# Patient Record
Sex: Female | Born: 1970
Health system: Southern US, Community
[De-identification: ages and names within clinical notes are randomized; demographics above are authoritative.]

## PROBLEM LIST (undated history)

## (undated) DIAGNOSIS — G2581 Restless legs syndrome: Secondary | ICD-10-CM

## (undated) DIAGNOSIS — F329 Major depressive disorder, single episode, unspecified: Secondary | ICD-10-CM

## (undated) DIAGNOSIS — G43909 Migraine, unspecified, not intractable, without status migrainosus: Secondary | ICD-10-CM

## (undated) DIAGNOSIS — S022XXA Fracture of nasal bones, initial encounter for closed fracture: Secondary | ICD-10-CM

## (undated) DIAGNOSIS — K219 Gastro-esophageal reflux disease without esophagitis: Secondary | ICD-10-CM

## (undated) DIAGNOSIS — S0291XA Unspecified fracture of skull, initial encounter for closed fracture: Secondary | ICD-10-CM

## (undated) DIAGNOSIS — F909 Attention-deficit hyperactivity disorder, unspecified type: Secondary | ICD-10-CM

## (undated) DIAGNOSIS — E559 Vitamin D deficiency, unspecified: Secondary | ICD-10-CM

## (undated) HISTORY — DX: Major depressive disorder, single episode, unspecified: F32.9

## (undated) HISTORY — DX: Migraine, unspecified, not intractable, without status migrainosus: G43.909

## (undated) HISTORY — DX: Restless legs syndrome: G25.81

## (undated) HISTORY — DX: Vitamin D deficiency, unspecified: E55.9

## (undated) HISTORY — PX: WISDOM TOOTH EXTRACTION: SHX21

---

## 1998-06-28 ENCOUNTER — Encounter: Admission: RE | Admit: 1998-06-28 | Discharge: 1998-06-28 | Payer: Self-pay | Admitting: Family Medicine

## 1998-12-26 ENCOUNTER — Encounter: Admission: RE | Admit: 1998-12-26 | Discharge: 1998-12-26 | Payer: Self-pay | Admitting: Family Medicine

## 1999-01-09 ENCOUNTER — Encounter: Admission: RE | Admit: 1999-01-09 | Discharge: 1999-01-09 | Payer: Self-pay | Admitting: Family Medicine

## 1999-01-27 ENCOUNTER — Encounter: Admission: RE | Admit: 1999-01-27 | Discharge: 1999-01-27 | Payer: Self-pay | Admitting: Family Medicine

## 1999-01-29 ENCOUNTER — Encounter: Admission: RE | Admit: 1999-01-29 | Discharge: 1999-01-29 | Payer: Self-pay | Admitting: Family Medicine

## 1999-06-06 ENCOUNTER — Encounter: Admission: RE | Admit: 1999-06-06 | Discharge: 1999-06-06 | Payer: Self-pay | Admitting: Family Medicine

## 1999-07-06 ENCOUNTER — Emergency Department (HOSPITAL_COMMUNITY): Admission: EM | Admit: 1999-07-06 | Discharge: 1999-07-06 | Payer: Self-pay | Admitting: Emergency Medicine

## 2000-12-25 ENCOUNTER — Emergency Department (HOSPITAL_COMMUNITY): Admission: EM | Admit: 2000-12-25 | Discharge: 2000-12-25 | Payer: Self-pay | Admitting: Emergency Medicine

## 2002-06-04 ENCOUNTER — Emergency Department (HOSPITAL_COMMUNITY): Admission: EM | Admit: 2002-06-04 | Discharge: 2002-06-04 | Payer: Self-pay | Admitting: Emergency Medicine

## 2003-06-07 ENCOUNTER — Ambulatory Visit (HOSPITAL_COMMUNITY): Admission: RE | Admit: 2003-06-07 | Discharge: 2003-06-07 | Payer: Self-pay | Admitting: Infectious Diseases

## 2004-12-01 ENCOUNTER — Emergency Department (HOSPITAL_COMMUNITY): Admission: EM | Admit: 2004-12-01 | Discharge: 2004-12-01 | Payer: Self-pay | Admitting: Emergency Medicine

## 2005-08-21 ENCOUNTER — Other Ambulatory Visit: Admission: RE | Admit: 2005-08-21 | Discharge: 2005-08-21 | Payer: Self-pay | Admitting: Obstetrics and Gynecology

## 2005-10-11 ENCOUNTER — Emergency Department (HOSPITAL_COMMUNITY): Admission: EM | Admit: 2005-10-11 | Discharge: 2005-10-11 | Payer: Self-pay | Admitting: Emergency Medicine

## 2008-07-13 ENCOUNTER — Telehealth: Payer: Self-pay | Admitting: Family Medicine

## 2008-08-17 ENCOUNTER — Ambulatory Visit: Payer: Self-pay | Admitting: Family Medicine

## 2008-08-30 ENCOUNTER — Ambulatory Visit: Payer: Self-pay | Admitting: Family Medicine

## 2008-08-30 DIAGNOSIS — K219 Gastro-esophageal reflux disease without esophagitis: Secondary | ICD-10-CM | POA: Insufficient documentation

## 2008-08-30 DIAGNOSIS — G4700 Insomnia, unspecified: Secondary | ICD-10-CM | POA: Insufficient documentation

## 2008-09-01 ENCOUNTER — Encounter: Admission: RE | Admit: 2008-09-01 | Discharge: 2008-09-01 | Payer: Self-pay | Admitting: Sports Medicine

## 2008-09-11 ENCOUNTER — Encounter: Payer: Self-pay | Admitting: *Deleted

## 2008-09-17 ENCOUNTER — Ambulatory Visit: Payer: Self-pay | Admitting: Family Medicine

## 2008-09-17 DIAGNOSIS — J309 Allergic rhinitis, unspecified: Secondary | ICD-10-CM | POA: Insufficient documentation

## 2008-09-26 ENCOUNTER — Encounter: Payer: Self-pay | Admitting: *Deleted

## 2008-12-06 ENCOUNTER — Telehealth: Payer: Self-pay | Admitting: Family Medicine

## 2008-12-06 ENCOUNTER — Ambulatory Visit: Payer: Self-pay | Admitting: Family Medicine

## 2008-12-20 ENCOUNTER — Ambulatory Visit: Payer: Self-pay | Admitting: Family Medicine

## 2008-12-24 ENCOUNTER — Encounter: Payer: Self-pay | Admitting: *Deleted

## 2009-01-07 ENCOUNTER — Ambulatory Visit: Payer: Self-pay | Admitting: Family Medicine

## 2009-01-07 LAB — CONVERTED CEMR LAB
Cholesterol: 121 mg/dL (ref 0–200)
HDL: 42 mg/dL (ref >39)
LDL Cholesterol: 51 mg/dL (ref 0–99)
Total CHOL/HDL Ratio: 2.9
Triglycerides: 139 mg/dL (ref <150)
VLDL: 28 mg/dL (ref 0–40)

## 2009-01-08 ENCOUNTER — Encounter: Payer: Self-pay | Admitting: Family Medicine

## 2009-05-22 ENCOUNTER — Emergency Department (HOSPITAL_COMMUNITY): Admission: EM | Admit: 2009-05-22 | Discharge: 2009-05-22 | Payer: Self-pay | Admitting: Family Medicine

## 2009-07-29 ENCOUNTER — Ambulatory Visit: Payer: Self-pay | Admitting: Family Medicine

## 2009-07-29 LAB — CONVERTED CEMR LAB
ALT: 10 units/L (ref 0–35)
AST: 12 units/L (ref 0–37)
Albumin: 4.4 g/dL (ref 3.5–5.2)
Alkaline Phosphatase: 46 units/L (ref 39–117)
BUN: 10 mg/dL (ref 6–23)
CO2: 24 meq/L (ref 19–32)
Calcium: 9.8 mg/dL (ref 8.4–10.5)
Chloride: 105 meq/L (ref 96–112)
Creatinine, Ser: 0.74 mg/dL (ref 0.40–1.20)
Glucose, Bld: 85 mg/dL (ref 70–99)
HCT: 41.9 % (ref 36.0–46.0)
Hemoglobin: 14.1 g/dL (ref 12.0–15.0)
MCHC: 33.7 g/dL (ref 30.0–36.0)
MCV: 93.9 fL (ref 78.0–100.0)
Platelets: 308 10*3/uL (ref 150–400)
Potassium: 4 meq/L (ref 3.5–5.3)
RBC: 4.46 M/uL (ref 3.87–5.11)
RDW: 13 % (ref 11.5–15.5)
Sodium: 140 meq/L (ref 135–145)
TSH: 0.898 microintl units/mL (ref 0.350–4.500)
Total Bilirubin: 0.9 mg/dL (ref 0.3–1.2)
Total Protein: 6.8 g/dL (ref 6.0–8.3)
WBC: 7.8 10*3/uL (ref 4.0–10.5)

## 2009-09-02 ENCOUNTER — Ambulatory Visit: Payer: Self-pay | Admitting: Family Medicine

## 2009-10-17 ENCOUNTER — Telehealth: Payer: Self-pay | Admitting: *Deleted

## 2009-11-28 ENCOUNTER — Ambulatory Visit: Payer: Self-pay | Admitting: Family Medicine

## 2009-11-28 DIAGNOSIS — R42 Dizziness and giddiness: Secondary | ICD-10-CM | POA: Insufficient documentation

## 2009-11-28 LAB — CONVERTED CEMR LAB
Ferritin: 89 ng/mL (ref 10–291)
HCT: 38.4 % (ref 36.0–46.0)
Hemoglobin: 12.9 g/dL (ref 12.0–15.0)
MCHC: 33.6 g/dL (ref 30.0–36.0)
MCV: 95.5 fL (ref 78.0–100.0)
Platelets: 264 10*3/uL (ref 150–400)
RBC: 4.02 M/uL (ref 3.87–5.11)
RDW: 12.4 % (ref 11.5–15.5)
TSH: 3.291 microintl units/mL (ref 0.350–4.500)
WBC: 10.2 10*3/uL (ref 4.0–10.5)

## 2009-11-29 ENCOUNTER — Telehealth: Payer: Self-pay | Admitting: Family Medicine

## 2009-12-04 ENCOUNTER — Encounter: Payer: Self-pay | Admitting: *Deleted

## 2009-12-09 ENCOUNTER — Ambulatory Visit: Payer: Self-pay | Admitting: Family Medicine

## 2009-12-09 DIAGNOSIS — F4323 Adjustment disorder with mixed anxiety and depressed mood: Secondary | ICD-10-CM | POA: Insufficient documentation

## 2010-05-30 ENCOUNTER — Emergency Department (HOSPITAL_COMMUNITY)
Admission: EM | Admit: 2010-05-30 | Discharge: 2010-05-30 | Payer: Self-pay | Source: Home / Self Care | Admitting: Family Medicine

## 2010-06-24 ENCOUNTER — Encounter: Payer: Self-pay | Admitting: Family Medicine

## 2010-07-17 NOTE — Assessment & Plan Note (Signed)
Summary: f/u eo   Vital Signs:  Patient profile:   40 year old female Height:      66 inches Weight:      174 pounds BMI:     28.19 BSA:     1.89 Temp:     98.4 degrees F Pulse rate:   93 / minute BP sitting:   113 / 82  Vitals Entered By: Jone Baseman CMA (December 09, 2009 2:39 PM) CC: f/u Is Patient Diabetic? No Pain Assessment Patient in pain? no        CC:  f/u.  History of Present Illness: Depression Feeling very upset and crying frequently due to possible breaking up with her girlfriend and mind won't shut off.  No suicidal ideation.  Appetite is down and not sleeping well.  Did not call back to schedule an appt with a psychiatrist because was out of town.    Does have friends she can talk with.  Feels needs to go to work today.  Did take whole trazadone yesterday but still not able to sleep.    ROS - as above PMH - Medications reviewed and updated in medication list.  Smoking Status noted in VS form    Habits & Providers  Alcohol-Tobacco-Diet     Tobacco Status: never  Current Medications (verified): 1)  Lysteda 650 Mg Tabs (Tranexamic Acid) .... During Menstrual Period 6 Tabs Per Day Per Dr Algie Coffer 2)  Seroquel 100 Mg  Tabs (Quetiapine Fumarate) .... 1/2 At Bedtime May Increase To 2 After 1-2 Days  Allergies: 1)  ! Ultram  Physical Exam  General:  Well-developed,well-nourished,in no acute distress; alert,appropriate and cooperative throughout examination Psych:  Crying at beginning of visit and for most of interview but able to stop and compose herself when leaving.  Oriented no illusions or hallucinations.  No pressured speech.  Able to answer questions and come up with reasonable plans    Impression & Recommendations:  Problem # 1:  DEPRESSION (ICD-311) Assessment Deteriorated  probably bipolar type.  Given complexity and her family history feel she should be seen by psychiatrist.  Will stop trazadone and start seroquel.   No suicidal thoughts now  but is quite distraught.  Follow her closely until can get appointment with psychiatry  The following medications were removed from the medication list:    Trazodone Hcl 50 Mg Tabs (Trazodone hcl) .Marland Kitchen... 1 by mouth at bedtime for insomnia do not use every day  Orders: [facility]- Est  Level 4 ([address])  Complete Medication List: 1)  Lysteda 650 Mg Tabs (Tranexamic acid) .... During menstrual period 6 tabs per day per dr Algie Coffer 2)  Seroquel 100 Mg Tabs (Quetiapine fumarate) .... 1/2 at bedtime may increase to 2 after 1-2 days  Patient Instructions: 1)  Please schedule a follow-up appointment in 1 weeks - ok to dble book 2)  Call me if feeling worse 3)  Take 1/2 of seroquel 1 heart rate before bed time 4)  May increase to a whole tablet as needed 5)  Call for an appointment today with a psychiatrist  Prescriptions: SEROQUEL 100 MG  TABS (QUETIAPINE FUMARATE) 1/2 at bedtime may increase to 2 after 1-2 days  #30 x 0   Entered and Authorized by:   Pearlean Brownie MD   Signed by:   Pearlean Brownie MD on 12/09/2009   Method used:   Electronically to        [facility]* (retail)  [address]       [address] Shipping/mailing       [place], [place]  [address]       Ph: [phone]       Fax: [phone]   RxID:   [id number]

## 2010-07-17 NOTE — Assessment & Plan Note (Signed)
Summary: f/u meds,df   Vital Signs:  Patient profile:   40 year old female Height:      66 inches Weight:      183.1 pounds BMI:     29.66 Pulse rate:   89 / minute BP sitting:   143 / 86  (right arm) Cuff size:   regular  Vitals Entered By: Garen Grams LPN (July 29, 2009 4:10 PM)  CC: trouble sleeping Is Patient Diabetic? No Pain Assessment Patient in pain? no        CC:  trouble sleeping.  History of Present Illness: Mood Insomnia Has been very down since broke up with her partner in Jan?  Trouble sleeping, frequent crying, intentional weight loss.  Was on cloud nine before breakup.  No history of depression or bipolar or any previous medication therapy.  No suicidal ideation   Does have strong famiily hx of psychiatric disorders in her mother.   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 1 Tablet By Mouth Qd 2)  Zolpidem Tartrate 10 Mg Tabs (Zolpidem Tartrate) .Marland Kitchen.. 1 Tablet By Mouth Once Daily  Allergies: 1)  ! Ultram  Physical Exam  General:  Well-developed,well-nourished,in no acute distress; alert,appropriate and cooperative throughout examination Psych:  Cognition and judgment appear intact. Alert and cooperative with normal attention span and concentration. No apparent delusions, illusions, hallucinations   Impression & Recommendations:  Problem # 1:  INSOMNIA-SLEEP DISORDER-UNSPEC (ICD-780.52) Assessment Deteriorated  coupled with mood symptoms maybe more consistent with depression.  Her MDQ had only 3/13 positive with causing only a minor problem and PH9 only 4 points with somewhat difficult.   Still feel counseling would like be beneficial.  Will see her back soon Does not seem to be at risk for suicide Her updated  medication list for this problem includes:    Zolpidem Tartrate 10 Mg Tabs (Zolpidem tartrate) .Marland Kitchen... 1 tablet by mouth once daily  Orders: Comp Met[facility] ([address]) CBC-[facility] ([address]) TSH-[facility] ([id number]) [facility]- Est  Level 4 ([address])  Problem # 2:  GERD (ICD-530.81) Assessment: Improved Can take ppi as needed  Her updated medication list for this problem includes:    Protonix 40 Mg Tbec (Pantoprazole sodium) .Marland Kitchen... 1 tablet by mouth once daily prn  Complete Medication List: 1)  Protonix 40 Mg Tbec (Pantoprazole sodium) .Marland Kitchen.. 1 tablet by mouth once daily prn 2)  Zolpidem Tartrate 10 Mg Tabs (Zolpidem tartrate) .Marland Kitchen.. 1 tablet by mouth once daily  Patient Instructions: 1)  Please schedule a follow-up appointment in 2 weeks.  2)  Call me if you are feeling worse 3)  Consider a counselor call me if you would like a referral

## 2010-07-17 NOTE — Progress Notes (Signed)
Summary: meds prob  Phone Note Call from Patient Call back at Home Phone [phone]   Caller: Patient Summary of Call: ROZEREM 8 MG TABS (RAMELTEON) is definately not working and needs something else to help her sleep [facility] Initial call taken by: De Nurse,  Oct 17, 2009 2:35 PM  Follow-up for Phone Call        to pcp Follow-up by: Golden Circle RN,  Oct 17, 2009 2:57 PM  Additional Follow-up for Phone Call Additional follow up Details #1::        Rx'd trazadone as a trial.  Do not use it every night.   She needs to come in for ov and Pap if she wants to have it done here Additional Follow-up by: Pearlean Brownie MD,  Oct 17, 2009 3:19 PM    Additional Follow-up for Phone Call Additional follow up Details #2::    Left message on vm for patient to return call. Follow-up by: Garen Grams LPN,  Oct 17, 1960 3:36 PM  Additional Follow-up for Phone Call Additional follow up Details #3:: Details for Additional Follow-up Action Taken: Patient informed of above, states she has her paps done at her OBGYN office (Dr. Algie Coffer). States she will schedule an ov in the next couple weeks to f/u on how new med is working. Additional Follow-up by: Garen Grams LPN,  Oct 18, 2295 5:14 PM  New/Updated Medications: TRAZODONE HCL 50 MG TABS (TRAZODONE HCL) 1 by mouth at bedtime for insomnia do not use every day Prescriptions: TRAZODONE HCL 50 MG TABS (TRAZODONE HCL) 1 by mouth at bedtime for insomnia do not use every day  #20 x 1   Entered and Authorized by:   Pearlean Brownie MD   Signed by:   Pearlean Brownie MD on 10/17/2009   Method used:   Electronically to        [facility]* (retail)       [address]       [address] Shipping/mailing       [place], [place]  [address]       Ph: [phone]       Fax: [phone]   RxID:   [id number]

## 2010-07-17 NOTE — Miscellaneous (Signed)
Summary: Re; Psychiatic referral  Clinical Lists Changes  called and left message yesterday and today asking patient to call back. need to speak with her about referral.  Theresia Lo RN  December 04, 2009 4:06 PM  patient in office today and gave her information regarding psychiatric referral. she will contact [facility] to schedule. advised her to call us back if any problem. Theresia Lo RN  December 09, 2009 4:27 PM

## 2010-07-17 NOTE — Assessment & Plan Note (Signed)
Summary: f/up,tcb   Vital Signs:  Patient profile:   40 year old female Height:      66 inches Weight:      181 pounds BMI:     29.32 BSA:     1.92 Temp:     98.5 degrees F Pulse rate:   89 / minute BP sitting:   144 / 85  Vitals Entered By: Jone Baseman CMA (September 02, 2009 3:09 PM) CC: f/u Is Patient Diabetic? No Pain Assessment Patient in pain? yes     Location: throat Intensity: 8   CC:  f/u.  History of Present Illness: Insomnia continues almost every night.  Ambien not helping much.  Has worked on Physiological scientist.  Feels her mind is racing when tries to sleep.  No anxiety problems during the day and no anhedonia or feeling down.    Goes to sleep at 5 AM and tries to Sleep to 1-2 PM.  Exercises about 2 AM.  No suicidal ideation  URI symptoms Cough with post nasal drip and sore throat for 4-5 days. No fever or focal chest pain or shortness of breath. No rash OTC medications helps a little.  Does not have regular spring allergies.    ROS - as above PMH - Medications reviewed and updated in medication list.  Smoking Status noted in VS form    Habits & Providers  Alcohol-Tobacco-Diet     Tobacco Status: never  Current Medications (verified): 1)  Protonix 40 Mg Tbec (Pantoprazole Sodium) .Marland Kitchen.. 1 Tablet By Mouth Once Daily Prn 2)  Zolpidem Tartrate 10 Mg Tabs (Zolpidem Tartrate) .Marland Kitchen.. 1 Tablet By Mouth Once Daily  Allergies: 1)  ! Ultram  Physical Exam  General:  Well-developed,well-nourished,in no acute distress; alert,appropriate and cooperative throughout examination Mouth:  Oral mucosa and oropharynx without lesions or exudates.  Teeth in good repair.  Visible clear drainage in pharynx  Neck:  No deformities, masses, or tenderness noted. Lungs:  Normal respiratory effort, chest expands symmetrically. Lungs are clear to auscultation, no crackles or wheezes. Heart:  Normal rate and regular rhythm. S1 and S2 normal without gallop, murmur, click, rub or other  extra sounds. Skin:  Intact without suspicious lesions or rashes   Impression & Recommendations:  Problem # 1:  INSOMNIA-SLEEP DISORDER-UNSPEC (ICD-780.52)  Worsened.  Discussed how should not need hypnotics regularly and that some people will not have uninterrupted sleep.  Will try as needed rozerem.   The following medications were removed from the medication list:    Zolpidem Tartrate 10 Mg Tabs (Zolpidem tartrate) .Marland Kitchen... 1 tablet by mouth once daily Her updated medication list for this problem includes:    Rozerem 8 Mg Tabs (Ramelteon) .Marland Kitchen... 1 nightly for sleep 30 before bed do not use regularly  Orders: [facility]- Est  Level 4 ([address])  Problem # 2:  URI (ICD-465.9)  No signs of pneumonia or sinustitis.  Likely viral or allergic.  treat with antihistamine decongestant.  She can not use any nasal sprays  Her updated medication list for this problem includes:    Allegra-d 12 Hour 60-120 Mg Xr12h-tab (Fexofenadine-pseudoephedrine) .Marland Kitchen... 1every 12 hours for congestion and drainage  Orders: [facility]- Est  Level 4 ([address])  Complete Medication List: 1)  Protonix 40 Mg Tbec (Pantoprazole sodium) .Marland Kitchen.. 1 tablet by mouth once daily prn 2)  Rozerem 8 Mg Tabs (Ramelteon) .Marland Kitchen.. 1 nightly for sleep 30 before bed do not use regularly 3)  Allegra-d 12 Hour 60-120 Mg Xr12h-tab (Fexofenadine-pseudoephedrine) .Marland Kitchen.. 1every 12  hours for congestion and drainage  Patient Instructions: 1)  Call if not better in 10 day the cough sometimes lasts up to4-6 weeks 2)  Use the sleeping aid intermittently as needed Prescriptions: ALLEGRA-D 12 HOUR 60-120 MG XR12H-TAB (FEXOFENADINE-PSEUDOEPHEDRINE) 1every 12 hours for congestion and drainage  #20 x 0   Entered and Authorized by:   Pearlean Brownie MD   Signed by:   Pearlean Brownie MD on 09/02/2009   Method used:   Electronically to        [facility]* (retail)       [address]       [address] Shipping/mailing       [place], [place]   [address]       Ph: [phone]       Fax: [phone]   RxID:   [id number] ROZEREM 8 MG TABS (RAMELTEON) 1 nightly for sleep 30 before bed do not use regularly  #10 x 2   Entered and Authorized by:   Pearlean Brownie MD   Signed by:   Pearlean Brownie MD on 09/02/2009   Method used:   Electronically to        [facility]* (retail)       [address]       [address] Shipping/mailing       [place], [place]  [address]       Ph: [phone]       Fax: [phone]   RxID:   [id number]

## 2010-07-17 NOTE — Letter (Signed)
Summary: Generic Letter  [facility]  [address]   [place], [place] [address]   Phone: [phone]  Fax: [phone]    06/24/2010  [facility] [address] [place], [place]  [address]  Dear Ms. Sherry Ewing,  I have not seen you for a while and am writing to check to see how you are.    I trust the holidays went well.  If you need to contact me please call and leave a message and I will get back to you.   Sincerely,  Doneta Public MD  Appended Document: Generic Letter mailed

## 2010-07-17 NOTE — Progress Notes (Signed)
Summary: phn msg  Phone Note Call from Patient Call back at [facility] Phone [phone]   Caller: Patient Summary of Call: Pt says she is returning Dr Russ Halo call. Initial call taken by: Clydell Hakim,  November 29, 2009 3:40 PM  Follow-up for Phone Call        Discussed labs with her and her referral to Dr Katrinka Blazing Follow-up by: Pearlean Brownie MD,  November 29, 2009 3:50 PM

## 2010-07-17 NOTE — Assessment & Plan Note (Signed)
Summary: anxiety,df   Vital Signs:  Patient profile:   40 year old female Height:      66 inches Weight:      181 pounds BMI:     29.32 Temp:     98.2 degrees F oral Pulse rate:   73 / minute BP sitting:   132 / 83  (left arm) Cuff size:   regular  Vitals Entered By: Tessie Fass CMA (November 28, 2009 9:36 AM) Is Patient Diabetic? No Pain Assessment Patient in pain? no        History of Present Illness: Dizziness a feeling of very difficult to describe lightheadedness, euphoria, floating with menstrual period and occaisionally other times.  No chest pain or syncope or palpitations.   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  No recreational drugs.  Is sleeping well with trazadone.   Keeps a very busy schedule with work and exercise.   Recently started on Lyseda for menstrual period control by Dr Algie Coffer GYN also oxycodone for pain during menstrual period  ROS - as above PMH - Medications reviewed and updated in medication list.  Smoking Status noted in VS form    Habits & Providers  Alcohol-Tobacco-Diet     Tobacco Status: never  Current Medications (verified): 1)  Trazodone Hcl 50 Mg Tabs (Trazodone Hcl) .Marland Kitchen.. 1 By Mouth At Bedtime For Insomnia Do Not Use Every Day 2)  Lysteda 650 Mg Tabs (Tranexamic Acid) .... During Menstrual Period 6 Tabs Per Day Per Dr Algie Coffer  Allergies: 1)  ! Ultram  Social History: Pt lives alone.  Has relationship with a girl friend.  She works in Arts administrator at [employer].  She enjoys reading, movies, spending time with friends.  She drinks occassionally socially 1-2 drinks.   Physical Exam  General:  Well-developed,well-nourished,in no acute distress; alert,appropriate and cooperative throughout examination Psych:  mildly tangential speech but recovers on her own. Laughs mildly inappropriately.   Using her phone to list her concerns.  No  suicidal ideation    Impression & Recommendations:  Problem # 1:  DIZZINESS (ICD-780.4) Will check labs but most consistent with mental illness perhaps bipolar perhaps worsened with trazadone?   Has taken BDI and Bipolar written assements before and scored low but if labs are normal feel she should be evaluated by mental health professional. Her mother has bipolar and saw Dr Elna Breslow in past, she would not mind seeing him.  Orders: CBC-[facility] ([address]) TSH-[facility] ([id number]) Ferritin-[facility] ([address]) [facility]- Est  Level 4 ([address])  Problem # 2:  INSOMNIA-SLEEP DISORDER-UNSPEC (ICD-780.52) decrease to 25 mg at bedtime  The following medications were removed from the medication list:    Rozerem 8 Mg Tabs (Ramelteon) .Marland Kitchen... 1 nightly for sleep 30 before bed do not use regularly  Orders: [facility]- Est  Level 4 ([address])  Complete Medication List: 1)  Trazodone Hcl 50 Mg Tabs (Trazodone hcl) .Marland Kitchen.. 1 by mouth at bedtime for insomnia do not use every day 2)  Lysteda 650 Mg Tabs (Tranexamic acid) .... During menstrual period 6 tabs per day per dr Algie Coffer  Patient Instructions: 1)  Please schedule a follow-up appointment in 2 weeks.  2)  I will call you if your lab is abnormal otherwise I will send you a letter within 2 weeks. 3)  I will call with a suggestion of a mental health person to see 4)  Try 1/2 tablet of  trazadone to help with sleep   Appended Document: Orders Update - Referral    Clinical Lists Changes  Orders: Added new Referral order of Psychiatric Referral (Psych) - Signed

## 2010-07-28 ENCOUNTER — Encounter: Payer: Self-pay | Admitting: Family Medicine

## 2010-07-28 ENCOUNTER — Ambulatory Visit (INDEPENDENT_AMBULATORY_CARE_PROVIDER_SITE_OTHER): Payer: 59 | Admitting: Family Medicine

## 2010-07-28 DIAGNOSIS — F3289 Other specified depressive episodes: Secondary | ICD-10-CM

## 2010-07-28 DIAGNOSIS — N926 Irregular menstruation, unspecified: Secondary | ICD-10-CM

## 2010-07-28 DIAGNOSIS — M25579 Pain in unspecified ankle and joints of unspecified foot: Secondary | ICD-10-CM

## 2010-07-28 DIAGNOSIS — M25572 Pain in left ankle and joints of left foot: Secondary | ICD-10-CM

## 2010-07-28 DIAGNOSIS — F329 Major depressive disorder, single episode, unspecified: Secondary | ICD-10-CM

## 2010-07-28 NOTE — Assessment & Plan Note (Signed)
Seeing Dr Algie Coffer for gyn care.  Currently on mirena and BCPs

## 2010-07-28 NOTE — Patient Instructions (Signed)
Try padding for the ankle if it is not better call we can order an xray Keep exercising Your cholesterol is great Check your blood pressure over the next month

## 2010-07-28 NOTE — Progress Notes (Signed)
  Subjective:    Patient ID: Sherry Ewing, female    DOB: February 16, 1971, 40 y.o.   MRN: [id number]  HPI  L ankle pain Larey Seat while climbing in Sept or October.  Had negative xray.  Is back to full activity but has focal pain over distal lateral ankle.  No soft tissue swelling or bruising.  Able to walk run hop without pain   Depression Seeing Dr Shawnee Knapp.  Was on many meds but stopped all recently.  Feels well now.  No suicidal ideation.    Review of Systems     Objective:   Physical Exam    L ankle - FROM very focally tender over lateral distal malleolus.  No swelling or crepitus.  Able to stand and hop on one leg without pain or instability   Psych:  Good affection and conversation    Assessment & Plan:

## 2010-07-28 NOTE — Assessment & Plan Note (Signed)
Most consistent with pain after a sprain.  Has not laxity or swelling so think is healing appropriately.  Would not reimage unless persists.  She is concerned about having to wear boots with cause local pain suggested padding

## 2010-07-28 NOTE — Assessment & Plan Note (Signed)
Seeing Dr Shawnee Knapp for further care

## 2010-07-29 ENCOUNTER — Encounter: Payer: Self-pay | Admitting: Family Medicine

## 2010-07-30 ENCOUNTER — Inpatient Hospital Stay (HOSPITAL_COMMUNITY): Payer: 59

## 2010-07-30 ENCOUNTER — Inpatient Hospital Stay (HOSPITAL_COMMUNITY)
Admission: AD | Admit: 2010-07-30 | Discharge: 2010-07-30 | Disposition: A | Discharge status: Home / Self Care | Payer: 59 | Source: Ambulatory Visit | Attending: Obstetrics & Gynecology | Admitting: Obstetrics & Gynecology

## 2010-07-30 DIAGNOSIS — N949 Unspecified condition associated with female genital organs and menstrual cycle: Secondary | ICD-10-CM | POA: Insufficient documentation

## 2010-07-30 DIAGNOSIS — R52 Pain, unspecified: Secondary | ICD-10-CM

## 2010-07-30 DIAGNOSIS — N83209 Unspecified ovarian cyst, unspecified side: Secondary | ICD-10-CM | POA: Insufficient documentation

## 2010-07-30 LAB — URINE MICROSCOPIC-ADD ON

## 2010-07-30 LAB — URINALYSIS, ROUTINE W REFLEX MICROSCOPIC
Bilirubin Urine: NEGATIVE
Ketones, ur: NEGATIVE mg/dL
Leukocytes, UA: NEGATIVE
Nitrite: NEGATIVE
Protein, ur: NEGATIVE mg/dL
Specific Gravity, Urine: >1.03 — ABNORMAL HIGH (ref 1.005–1.030)
Urine Glucose, Fasting: NEGATIVE mg/dL
Urobilinogen, UA: 0.2 mg/dL (ref 0.0–1.0)
pH: 5.5 (ref 5.0–8.0)

## 2010-07-30 LAB — POCT PREGNANCY, URINE: Preg Test, Ur: NEGATIVE

## 2013-02-07 ENCOUNTER — Emergency Department (HOSPITAL_BASED_OUTPATIENT_CLINIC_OR_DEPARTMENT_OTHER): Payer: 59

## 2013-02-07 ENCOUNTER — Encounter (HOSPITAL_BASED_OUTPATIENT_CLINIC_OR_DEPARTMENT_OTHER): Payer: Self-pay | Admitting: *Deleted

## 2013-02-07 ENCOUNTER — Emergency Department (HOSPITAL_BASED_OUTPATIENT_CLINIC_OR_DEPARTMENT_OTHER)
Admission: EM | Admit: 2013-02-07 | Discharge: 2013-02-07 | Disposition: A | Discharge status: Home / Self Care | Payer: 59 | Attending: Emergency Medicine | Admitting: Emergency Medicine

## 2013-02-07 DIAGNOSIS — R296 Repeated falls: Secondary | ICD-10-CM | POA: Insufficient documentation

## 2013-02-07 DIAGNOSIS — S93402A Sprain of unspecified ligament of left ankle, initial encounter: Secondary | ICD-10-CM

## 2013-02-07 DIAGNOSIS — S93409A Sprain of unspecified ligament of unspecified ankle, initial encounter: Secondary | ICD-10-CM | POA: Insufficient documentation

## 2013-02-07 DIAGNOSIS — S93602A Unspecified sprain of left foot, initial encounter: Secondary | ICD-10-CM

## 2013-02-07 DIAGNOSIS — Y939 Activity, unspecified: Secondary | ICD-10-CM | POA: Insufficient documentation

## 2013-02-07 DIAGNOSIS — Z79899 Other long term (current) drug therapy: Secondary | ICD-10-CM | POA: Insufficient documentation

## 2013-02-07 DIAGNOSIS — Y929 Unspecified place or not applicable: Secondary | ICD-10-CM | POA: Insufficient documentation

## 2013-02-07 DIAGNOSIS — F909 Attention-deficit hyperactivity disorder, unspecified type: Secondary | ICD-10-CM | POA: Insufficient documentation

## 2013-02-07 DIAGNOSIS — K219 Gastro-esophageal reflux disease without esophagitis: Secondary | ICD-10-CM | POA: Insufficient documentation

## 2013-02-07 HISTORY — DX: Attention-deficit hyperactivity disorder, unspecified type: F90.9

## 2013-02-07 HISTORY — DX: Gastro-esophageal reflux disease without esophagitis: K21.9

## 2013-02-07 MED ORDER — IBUPROFEN 800 MG PO TABS
800.0000 mg | ORAL_TABLET | Freq: Once | ORAL | Status: AC
  Administered 2013-02-07: 800 mg via ORAL

## 2013-02-07 MED ORDER — IBUPROFEN 800 MG PO TABS
ORAL_TABLET | ORAL | Status: AC
  Administered 2013-02-07: 800 mg via ORAL
  Filled 2013-02-07: qty 1

## 2013-02-07 MED ORDER — HYDROCODONE-ACETAMINOPHEN 5-325 MG PO TABS: 1.0000 | ORAL_TABLET | Freq: Four times a day (QID) | ORAL | Status: DC | PRN

## 2013-02-07 NOTE — ED Provider Notes (Signed)
Scribed for Hanley Seamen, MD, the patient was seen in room MH02/MH02. This chart was scribed by Lewanda Rife, ED scribe. Patient's care was started at 0050  CSN: [id number]     Arrival date & time 02/07/13  0036 History   First MD Initiated Contact with Patient 02/07/13 0049     Chief Complaint  Patient presents with  . Foot Injury   (Consider location/radiation/quality/duration/timing/severity/associated sxs/prior Treatment) The history is provided by the patient.   HPI Comments: Sherry Ewing is a 42 y.o. female who presents to the Emergency Department complaining of constant moderate left foot and ankle onset acute PTA during a mechanical fall. Denies associated head injury, neck pain, back pain, left calf pain, and abdominal pain. Reports pain is exacerbated with weight bearing and alleviated by nothing. Denies taking any medications PTA to alleviate symptoms.      Past Medical History  Diagnosis Date  . ADHD (attention deficit hyperactivity disorder)   . GERD (gastroesophageal reflux disease)    Past Surgical History  Procedure Laterality Date  . Wisdom tooth extraction     No family history on file. History  Substance Use Topics  . Smoking status: Never Smoker   . Smokeless tobacco: Not on file  . Alcohol Use: Yes   OB History   Grav Para Term Preterm Abortions TAB SAB Ect Mult Living                 Review of Systems A complete 10 system review of systems was obtained and all systems are negative except as noted in the HPI and PMH.    Allergies  Tramadol hcl  Home Medications   Current Outpatient Rx  Name  Route  Sig  Dispense  Refill  . lisdexamfetamine (VYVANSE) 40 MG capsule   Oral   Take 40 mg by mouth every morning.         . pantoprazole (PROTONIX) 40 MG tablet   Oral   Take 40 mg by mouth daily.         . norgestrel-ethinyl estradiol (LO/OVRAL) 0.3-30 MG-MCG per tablet   Oral   Take 1 tablet by mouth daily.            BP 155/105   Pulse 88  Temp(Src) 98.3 F (36.8 C) (Oral)  Resp 18  Ht 5\' 6"  (1.676 m)  Wt 210 lb (95.255 kg)  BMI 33.91 kg/m2  SpO2 100% Physical Exam  Nursing note and vitals reviewed. Constitutional: She is oriented to person, place, and time. She appears well-developed and well-nourished. No distress.  HENT:  Head: Normocephalic and atraumatic.  Eyes: Conjunctivae and EOM are normal.  Neck: Neck supple. No tracheal deviation present.  Cardiovascular: Normal rate, regular rhythm, normal heart sounds, intact distal pulses and normal pulses.   No murmur heard. Pulses:      Dorsalis pedis pulses are 2+ on the left side.       Posterior tibial pulses are 2+ on the left side.  Cap refill < 3 seconds  Pulmonary/Chest: Effort normal and breath sounds normal. No respiratory distress. She has no wheezes.  Abdominal: Soft. Bowel sounds are normal. There is no tenderness.  Musculoskeletal: Normal range of motion.       Left ankle: Tenderness. Medial malleolus tenderness found. No proximal fibula tenderness found.       Cervical back: Normal. She exhibits no tenderness and no bony tenderness.       Thoracic back: Normal. She exhibits no tenderness and  no bony tenderness.       Lumbar back: Normal. She exhibits no tenderness and no bony tenderness.       Left lower leg: Normal. She exhibits no tenderness.  Tenderness over medial left arch   Neurological: She is alert and oriented to person, place, and time.  Distally neurovascularly intact   Skin: Skin is warm and dry.  Psychiatric: She has a normal mood and affect. Her behavior is normal.    ED Course  Procedures (including critical care time)   MDM  Nursing notes and vitals signs, including pulse oximetry, reviewed.  Summary of this visit's results, reviewed by myself:  Labs:  No results found for this or any previous visit (from the past 24 hour(s)).  Imaging Studies: Dg Ankle Complete Left  Feb 10, 2013   *RADIOLOGY REPORT*  Clinical  Data: Foot injury, now with medial ankle pain  LEFT ANKLE COMPLETE - 3+ VIEW  Comparison: 05/30/2010; left foot radiographs - earlier same day  Findings:  There is mild soft tissue swelling about the lateral malleolus. This finding is not associated as this fractures this location. The ankle mortise is preserved.  Joint spaces are preserved.  No ankle joint effusion.  Small plantar calcaneal spur.  IMPRESSION: 1.  Mild soft tissue swelling about the lateral malleolus without associated fracture. 2.  Small plantar calcaneal spur.   Original Report Authenticated By: Tacey Ruiz, MD   Dg Foot Complete Left  02-10-13   *RADIOLOGY REPORT*  Clinical Data: Post foot injury, now with plantar foot pain  LEFT FOOT - COMPLETE 3+ VIEW  Comparison: Left ankle radiographs - earlier same day  Findings:  No fracture or dislocation.  Joint spaces are preserved.  No erosions.  Regional soft tissues are normal.  No radiopaque foreign body.  Small plantar calcaneal spur.  IMPRESSION: 1.  No fracture or dislocation. 2.  Small plantar calcaneal spur.   Original Report Authenticated By: Tacey Ruiz, MD      I personally performed the services described in this documentation, which was scribed in my presence.  The recorded information has been reviewed and is accurate.   Hanley Seamen, MD 02/10/2013 [phone]

## 2013-02-07 NOTE — ED Notes (Signed)
Pt reports loosing her balance and falling, landed on her left foot. C/o pain in left foot and ankle.

## 2013-02-07 NOTE — ED Notes (Signed)
Rx x 1 given for norco- pt given ice pack for home use- pt has a ride

## 2014-07-23 ENCOUNTER — Emergency Department (HOSPITAL_COMMUNITY)
Admission: EM | Admit: 2014-07-23 | Discharge: 2014-07-23 | Disposition: A | Discharge status: Home / Self Care | Payer: Worker's Compensation | Attending: Emergency Medicine | Admitting: Emergency Medicine

## 2014-07-23 ENCOUNTER — Encounter (HOSPITAL_COMMUNITY): Payer: Self-pay | Admitting: Emergency Medicine

## 2014-07-23 DIAGNOSIS — F909 Attention-deficit hyperactivity disorder, unspecified type: Secondary | ICD-10-CM | POA: Diagnosis not present

## 2014-07-23 DIAGNOSIS — Z79899 Other long term (current) drug therapy: Secondary | ICD-10-CM | POA: Diagnosis not present

## 2014-07-23 DIAGNOSIS — K219 Gastro-esophageal reflux disease without esophagitis: Secondary | ICD-10-CM | POA: Insufficient documentation

## 2014-07-23 DIAGNOSIS — Z7721 Contact with and (suspected) exposure to potentially hazardous body fluids: Secondary | ICD-10-CM | POA: Insufficient documentation
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
## 2014-07-23 NOTE — Discharge Instructions (Signed)
Body Fluid Exposure Information °People may come into contact with blood and other body fluids under various circumstances. In some cases, body fluids may contain germs (bacteria or viruses) that cause infections. These germs can be spread when another person's body fluids come into contact with your skin, mouth, eyes, or genitals.  °Exposure to body fluids that may contain infectious material is a common problem for people providing care for others who are ill. It can occur when a person is performing health care tasks in the workplace or when taking care of a family member at home. Other common methods of exposure include injection drug use, sharing needles, and sexual activity. °The risk of an infection spreading through body fluid exposure is small and depends on a variety of factors. This includes the type of body fluid, the nature of the exposure, and the health status of the person who was the source of the body fluids. Your health care provider can help you assess the risk. °WHAT TYPES OF BODY FLUID CAN SPREAD INFECTION? °The following types of body fluid have the potential to spread infections: °· Blood. °· Semen. °· Vaginal secretions. °· Urine. °· Feces. °· Saliva. °· Nasal or eye discharge. °· Breast milk. °· Amniotic fluid and fluids surrounding body organs. °WHAT ARE SOME FIRST-AID MEASURES FOR BODY FLUID EXPOSURE? °The following steps should be taken as soon as possible after a person is exposed to body fluids: °Intact Skin °· For contact with closed skin, wash the area with soap and water. °Broken Skin °· For contact with broken skin (a wound), wash the area with soap and water. Let the area bleed a little. Then place a bandage or clean towel on the wound, applying gentle pressure to stop the bleeding. Do not squeeze or rub the area. °· Use just water or hand sanitizer if a sink with soap is not available. °· Do not use harsh chemicals such as bleach or iodine. °Eyes °· Rinse the eyes with water or  saline for 30 seconds. °· If the person is wearing contact lenses, leave the contact lenses in while rinsing the eyes. Once the rinsing is complete, remove the contact lenses. °Mouth °· Spit out the fluids. Rinse and spit with water 4-5 times. °In addition, you should remove any clothing that comes into contact with body fluids. However, if body fluid exposure results from sexual assault, seek medical care immediately without changing clothes or bathing. °WHEN SHOULD YOU SEEK HELP? °After performing the proper first-aid steps, you should contact your health care provider or seek emergency care right away if blood or other body fluids made contact with areas of broken skin or openings such as the eyes or mouth. If the exposure to body fluid happened in the workplace, you should report it to your work supervisor immediately. Many workplaces have procedures in place for exposure situations. °WHAT WILL HAPPEN AFTER YOU REPORT THE EXPOSURE? °Your health care provider will ask you several questions. Information requested may include: °· Your medical history, including vaccination records. °· Date and time of the exposure. °· Whether you saw body fluids during the exposure. °· Type of body fluid you were exposed to. °· Volume of body fluid you were exposed to. °· How the exposure happened. °· If any devices, such as a needle, were being used. °· Which area of your body made contact with the body fluid. °· Description of any injury to the skin or other area. °· How long contact was made with the body   fluid. °· Any information you have about the health status of the person whose body fluid you were exposed to. °The health care provider will assess your risk of infection. Often, no treatment is necessary. In some cases, the health care provider may recommend doing blood tests right away. Follow-up blood tests may also be done at certain intervals during the upcoming weeks and months to check for changes. You may be offered  treatment to prevent an infection from developing after exposure (post-exposure prophylaxis). This may include certain vaccinations or medicines and may be necessary when there is a risk of a serious infection, such as HIV or hepatitis B. Your health care provider should discuss appropriate treatment and vaccinations with you. °HOW CAN YOU PREVENT EXPOSURE AND INFECTION? °Always remember that prevention is the first line of defense against body fluid exposure. To help prevent exposure to body fluids: °· Wash and disinfect countertops and other surfaces regularly. °· Wear appropriate protective gear such as gloves, gowns, or eyewear when the possibility of exposure is present. °· Wipe away spills of body fluid with disposable towels. °· Properly dispose of blood products and other fluids. Use secured bags. °· Properly dispose of needles and other instruments with sharp points or edges (sharps). Use closed, marked containers. °· Avoid injection drug use. °· Do not share needles. °· Avoid recapping needles. °· Use a condom during sexual intercourse. °· Make sure you learn and follow any guidelines for preventing exposure (universal precautions) provided at your workplace. °To help reduce your chances of getting an infection: °· Make sure your vaccinations are up-to-date, including those for tetanus and hepatitis. °· Wash your hands frequently with soap and water. Use hand sanitizers. °· Avoid having multiple sex partners. °· Follow up with your health care provider as directed after being evaluated for an exposure to body fluids. °To avoid spreading infection to others: °· Do not have sexual relations until you know you are free of infection. °· Do not donate blood, plasma, breast milk, sperm, or other body fluids. °· Do not share hygiene tools such as toothbrushes, razors, or dental floss. °· Keep open wounds covered. °· Dispose of any items with blood on them (razors, tampons, bandages) by putting them in the  trash. °· Do not share drug supplies with others, such as needles, syringes, straws, or pipes. °· Follow all of your health care provider's instructions for preventing the spread of infection. °Document Released: 02/01/2013 Document Revised: 06/06/2013 Document Reviewed: 02/01/2013 °ExitCare® Patient Information ©2015 [employer]. This information is not intended to replace advice given to you by your health care provider. Make sure you discuss any questions you have with your health care provider. ° °
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
## 2014-07-23 NOTE — ED Notes (Signed)
Pt a/o x 4 on d/c. 

## 2014-09-14 ENCOUNTER — Encounter (HOSPITAL_COMMUNITY): Payer: Self-pay | Admitting: Emergency Medicine

## 2014-09-14 ENCOUNTER — Emergency Department (HOSPITAL_COMMUNITY)
Admission: EM | Admit: 2014-09-14 | Discharge: 2014-09-14 | Disposition: A | Discharge status: Home / Self Care | Payer: 59 | Attending: Emergency Medicine | Admitting: Emergency Medicine

## 2014-09-14 ENCOUNTER — Emergency Department (HOSPITAL_COMMUNITY): Payer: 59

## 2014-09-14 DIAGNOSIS — Z8781 Personal history of (healed) traumatic fracture: Secondary | ICD-10-CM | POA: Insufficient documentation

## 2014-09-14 DIAGNOSIS — S7002XA Contusion of left hip, initial encounter: Secondary | ICD-10-CM | POA: Diagnosis not present

## 2014-09-14 DIAGNOSIS — S59902A Unspecified injury of left elbow, initial encounter: Secondary | ICD-10-CM | POA: Diagnosis not present

## 2014-09-14 DIAGNOSIS — K219 Gastro-esophageal reflux disease without esophagitis: Secondary | ICD-10-CM | POA: Insufficient documentation

## 2014-09-14 DIAGNOSIS — S59912A Unspecified injury of left forearm, initial encounter: Secondary | ICD-10-CM | POA: Diagnosis present

## 2014-09-14 DIAGNOSIS — S80811A Abrasion, right lower leg, initial encounter: Secondary | ICD-10-CM | POA: Diagnosis not present

## 2014-09-14 DIAGNOSIS — S5012XA Contusion of left forearm, initial encounter: Secondary | ICD-10-CM

## 2014-09-14 DIAGNOSIS — F909 Attention-deficit hyperactivity disorder, unspecified type: Secondary | ICD-10-CM | POA: Diagnosis not present

## 2014-09-14 DIAGNOSIS — R000 Tachycardia, unspecified: Secondary | ICD-10-CM | POA: Insufficient documentation

## 2014-09-14 DIAGNOSIS — Y9389 Activity, other specified: Secondary | ICD-10-CM | POA: Diagnosis not present

## 2014-09-14 DIAGNOSIS — Y998 Other external cause status: Secondary | ICD-10-CM | POA: Insufficient documentation

## 2014-09-14 DIAGNOSIS — Y92410 Unspecified street and highway as the place of occurrence of the external cause: Secondary | ICD-10-CM | POA: Diagnosis not present

## 2014-09-14 DIAGNOSIS — S4992XA Unspecified injury of left shoulder and upper arm, initial encounter: Secondary | ICD-10-CM | POA: Diagnosis not present

## 2014-09-14 HISTORY — DX: Fracture of nasal bones, initial encounter for closed fracture: S02.2XXA

## 2014-09-14 HISTORY — DX: Unspecified fracture of skull, initial encounter for closed fracture: S02.91XA

## 2014-09-14 MED ORDER — MORPHINE SULFATE 4 MG/ML IJ SOLN
4.0000 mg | Freq: Once | INTRAMUSCULAR | Status: AC
  Administered 2014-09-14: 2 mg via INTRAVENOUS
  Filled 2014-09-14: qty 1

## 2014-09-14 MED ORDER — MORPHINE SULFATE 2 MG/ML IJ SOLN
INTRAMUSCULAR | Status: AC
  Filled 2014-09-14: qty 1

## 2014-09-14 MED ORDER — IBUPROFEN 800 MG PO TABS: 800.0000 mg | ORAL_TABLET | Freq: Three times a day (TID) | ORAL | Status: DC | PRN

## 2014-09-14 MED ORDER — ONDANSETRON HCL 4 MG/2ML IJ SOLN
4.0000 mg | Freq: Once | INTRAMUSCULAR | Status: AC
  Administered 2014-09-14: 4 mg via INTRAVENOUS
  Filled 2014-09-14: qty 2

## 2014-09-14 MED ORDER — METHOCARBAMOL 500 MG PO TABS: 500.0000 mg | ORAL_TABLET | Freq: Three times a day (TID) | ORAL | Status: DC | PRN

## 2014-09-14 MED ORDER — IBUPROFEN 800 MG PO TABS
800.0000 mg | ORAL_TABLET | Freq: Once | ORAL | Status: AC
  Administered 2014-09-14: 800 mg via ORAL
  Filled 2014-09-14: qty 1

## 2014-09-14 MED ORDER — MORPHINE SULFATE 2 MG/ML IJ SOLN
2.0000 mg | Freq: Once | INTRAMUSCULAR | Status: AC
  Administered 2014-09-14: 2 mg via INTRAVENOUS

## 2014-09-14 MED ORDER — LORAZEPAM 2 MG/ML IJ SOLN
1.0000 mg | Freq: Once | INTRAMUSCULAR | Status: AC
  Administered 2014-09-14: 1 mg via INTRAVENOUS
  Filled 2014-09-14: qty 1

## 2014-09-14 NOTE — ED Notes (Signed)
Pt stood to use bedside commode. Pt reports dizziness and nausea. PA aware.

## 2014-09-14 NOTE — ED Notes (Signed)
Pt ambulated in the hallway without difficulty. Reports pain in left leg but able to bear weight.
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
## 2016-07-11 DIAGNOSIS — J0140 Acute pansinusitis, unspecified: Secondary | ICD-10-CM | POA: Diagnosis not present

## 2016-07-11 DIAGNOSIS — J209 Acute bronchitis, unspecified: Secondary | ICD-10-CM | POA: Diagnosis not present

## 2016-07-21 DIAGNOSIS — R05 Cough: Secondary | ICD-10-CM | POA: Diagnosis not present

## 2016-07-21 DIAGNOSIS — R6889 Other general symptoms and signs: Secondary | ICD-10-CM | POA: Diagnosis not present

## 2016-09-22 DIAGNOSIS — F5101 Primary insomnia: Secondary | ICD-10-CM | POA: Diagnosis not present

## 2016-09-22 DIAGNOSIS — Z13220 Encounter for screening for lipoid disorders: Secondary | ICD-10-CM | POA: Diagnosis not present

## 2017-01-27 DIAGNOSIS — M9901 Segmental and somatic dysfunction of cervical region: Secondary | ICD-10-CM | POA: Diagnosis not present

## 2017-01-27 DIAGNOSIS — M9902 Segmental and somatic dysfunction of thoracic region: Secondary | ICD-10-CM | POA: Diagnosis not present

## 2017-01-27 DIAGNOSIS — M9903 Segmental and somatic dysfunction of lumbar region: Secondary | ICD-10-CM | POA: Diagnosis not present

## 2017-02-01 DIAGNOSIS — M9902 Segmental and somatic dysfunction of thoracic region: Secondary | ICD-10-CM | POA: Diagnosis not present

## 2017-02-01 DIAGNOSIS — M9903 Segmental and somatic dysfunction of lumbar region: Secondary | ICD-10-CM | POA: Diagnosis not present

## 2017-02-01 DIAGNOSIS — M9901 Segmental and somatic dysfunction of cervical region: Secondary | ICD-10-CM | POA: Diagnosis not present

## 2017-02-10 DIAGNOSIS — M9902 Segmental and somatic dysfunction of thoracic region: Secondary | ICD-10-CM | POA: Diagnosis not present

## 2017-02-10 DIAGNOSIS — M9901 Segmental and somatic dysfunction of cervical region: Secondary | ICD-10-CM | POA: Diagnosis not present

## 2017-02-10 DIAGNOSIS — M9903 Segmental and somatic dysfunction of lumbar region: Secondary | ICD-10-CM | POA: Diagnosis not present

## 2017-02-19 DIAGNOSIS — M9902 Segmental and somatic dysfunction of thoracic region: Secondary | ICD-10-CM | POA: Diagnosis not present

## 2017-02-19 DIAGNOSIS — M9903 Segmental and somatic dysfunction of lumbar region: Secondary | ICD-10-CM | POA: Diagnosis not present

## 2017-02-19 DIAGNOSIS — M9901 Segmental and somatic dysfunction of cervical region: Secondary | ICD-10-CM | POA: Diagnosis not present

## 2017-02-24 DIAGNOSIS — M9902 Segmental and somatic dysfunction of thoracic region: Secondary | ICD-10-CM | POA: Diagnosis not present

## 2017-02-24 DIAGNOSIS — M9903 Segmental and somatic dysfunction of lumbar region: Secondary | ICD-10-CM | POA: Diagnosis not present

## 2017-02-24 DIAGNOSIS — M9901 Segmental and somatic dysfunction of cervical region: Secondary | ICD-10-CM | POA: Diagnosis not present

## 2017-03-01 DIAGNOSIS — M9901 Segmental and somatic dysfunction of cervical region: Secondary | ICD-10-CM | POA: Diagnosis not present

## 2017-03-01 DIAGNOSIS — M9902 Segmental and somatic dysfunction of thoracic region: Secondary | ICD-10-CM | POA: Diagnosis not present

## 2017-03-01 DIAGNOSIS — M9903 Segmental and somatic dysfunction of lumbar region: Secondary | ICD-10-CM | POA: Diagnosis not present

## 2017-03-15 DIAGNOSIS — G2581 Restless legs syndrome: Secondary | ICD-10-CM | POA: Diagnosis not present

## 2017-03-15 DIAGNOSIS — M9903 Segmental and somatic dysfunction of lumbar region: Secondary | ICD-10-CM | POA: Diagnosis not present

## 2017-03-15 DIAGNOSIS — M9902 Segmental and somatic dysfunction of thoracic region: Secondary | ICD-10-CM | POA: Diagnosis not present

## 2017-03-15 DIAGNOSIS — M9901 Segmental and somatic dysfunction of cervical region: Secondary | ICD-10-CM | POA: Diagnosis not present

## 2017-03-24 DIAGNOSIS — M9902 Segmental and somatic dysfunction of thoracic region: Secondary | ICD-10-CM | POA: Diagnosis not present

## 2017-03-24 DIAGNOSIS — M9901 Segmental and somatic dysfunction of cervical region: Secondary | ICD-10-CM | POA: Diagnosis not present

## 2017-03-24 DIAGNOSIS — M9903 Segmental and somatic dysfunction of lumbar region: Secondary | ICD-10-CM | POA: Diagnosis not present

## 2017-03-29 DIAGNOSIS — M9901 Segmental and somatic dysfunction of cervical region: Secondary | ICD-10-CM | POA: Diagnosis not present

## 2017-03-29 DIAGNOSIS — M9903 Segmental and somatic dysfunction of lumbar region: Secondary | ICD-10-CM | POA: Diagnosis not present

## 2017-03-29 DIAGNOSIS — M9902 Segmental and somatic dysfunction of thoracic region: Secondary | ICD-10-CM | POA: Diagnosis not present

## 2017-04-12 DIAGNOSIS — M9902 Segmental and somatic dysfunction of thoracic region: Secondary | ICD-10-CM | POA: Diagnosis not present

## 2017-04-12 DIAGNOSIS — M9901 Segmental and somatic dysfunction of cervical region: Secondary | ICD-10-CM | POA: Diagnosis not present

## 2017-04-12 DIAGNOSIS — M9903 Segmental and somatic dysfunction of lumbar region: Secondary | ICD-10-CM | POA: Diagnosis not present

## 2017-04-26 DIAGNOSIS — M9902 Segmental and somatic dysfunction of thoracic region: Secondary | ICD-10-CM | POA: Diagnosis not present

## 2017-04-26 DIAGNOSIS — M9903 Segmental and somatic dysfunction of lumbar region: Secondary | ICD-10-CM | POA: Diagnosis not present

## 2017-04-26 DIAGNOSIS — M9901 Segmental and somatic dysfunction of cervical region: Secondary | ICD-10-CM | POA: Diagnosis not present

## 2017-05-10 DIAGNOSIS — M9901 Segmental and somatic dysfunction of cervical region: Secondary | ICD-10-CM | POA: Diagnosis not present

## 2017-05-10 DIAGNOSIS — M9902 Segmental and somatic dysfunction of thoracic region: Secondary | ICD-10-CM | POA: Diagnosis not present

## 2017-05-10 DIAGNOSIS — M9903 Segmental and somatic dysfunction of lumbar region: Secondary | ICD-10-CM | POA: Diagnosis not present

## 2017-05-28 DIAGNOSIS — M9903 Segmental and somatic dysfunction of lumbar region: Secondary | ICD-10-CM | POA: Diagnosis not present

## 2017-05-28 DIAGNOSIS — M9901 Segmental and somatic dysfunction of cervical region: Secondary | ICD-10-CM | POA: Diagnosis not present

## 2017-05-28 DIAGNOSIS — M9902 Segmental and somatic dysfunction of thoracic region: Secondary | ICD-10-CM | POA: Diagnosis not present

## 2017-06-02 DIAGNOSIS — M9903 Segmental and somatic dysfunction of lumbar region: Secondary | ICD-10-CM | POA: Diagnosis not present

## 2017-06-02 DIAGNOSIS — M9901 Segmental and somatic dysfunction of cervical region: Secondary | ICD-10-CM | POA: Diagnosis not present

## 2017-06-02 DIAGNOSIS — M9902 Segmental and somatic dysfunction of thoracic region: Secondary | ICD-10-CM | POA: Diagnosis not present

## 2017-06-25 DIAGNOSIS — M9901 Segmental and somatic dysfunction of cervical region: Secondary | ICD-10-CM | POA: Diagnosis not present

## 2017-06-25 DIAGNOSIS — M9903 Segmental and somatic dysfunction of lumbar region: Secondary | ICD-10-CM | POA: Diagnosis not present

## 2017-06-25 DIAGNOSIS — M9902 Segmental and somatic dysfunction of thoracic region: Secondary | ICD-10-CM | POA: Diagnosis not present

## 2017-08-28 DIAGNOSIS — J019 Acute sinusitis, unspecified: Secondary | ICD-10-CM | POA: Diagnosis not present

## 2017-08-28 DIAGNOSIS — R0982 Postnasal drip: Secondary | ICD-10-CM | POA: Diagnosis not present

## 2017-08-28 DIAGNOSIS — B9689 Other specified bacterial agents as the cause of diseases classified elsewhere: Secondary | ICD-10-CM | POA: Diagnosis not present

## 2017-09-13 DIAGNOSIS — J4521 Mild intermittent asthma with (acute) exacerbation: Secondary | ICD-10-CM | POA: Diagnosis not present

## 2017-09-13 DIAGNOSIS — J019 Acute sinusitis, unspecified: Secondary | ICD-10-CM | POA: Diagnosis not present

## 2017-10-12 DIAGNOSIS — M9903 Segmental and somatic dysfunction of lumbar region: Secondary | ICD-10-CM | POA: Diagnosis not present

## 2017-10-12 DIAGNOSIS — M9901 Segmental and somatic dysfunction of cervical region: Secondary | ICD-10-CM | POA: Diagnosis not present

## 2017-10-12 DIAGNOSIS — M9902 Segmental and somatic dysfunction of thoracic region: Secondary | ICD-10-CM | POA: Diagnosis not present

## 2017-10-15 DIAGNOSIS — M9901 Segmental and somatic dysfunction of cervical region: Secondary | ICD-10-CM | POA: Diagnosis not present

## 2017-10-15 DIAGNOSIS — M9902 Segmental and somatic dysfunction of thoracic region: Secondary | ICD-10-CM | POA: Diagnosis not present

## 2017-10-15 DIAGNOSIS — M9903 Segmental and somatic dysfunction of lumbar region: Secondary | ICD-10-CM | POA: Diagnosis not present

## 2017-10-21 DIAGNOSIS — M9901 Segmental and somatic dysfunction of cervical region: Secondary | ICD-10-CM | POA: Diagnosis not present

## 2017-10-21 DIAGNOSIS — M9903 Segmental and somatic dysfunction of lumbar region: Secondary | ICD-10-CM | POA: Diagnosis not present

## 2017-10-21 DIAGNOSIS — M9902 Segmental and somatic dysfunction of thoracic region: Secondary | ICD-10-CM | POA: Diagnosis not present

## 2017-10-25 DIAGNOSIS — M9903 Segmental and somatic dysfunction of lumbar region: Secondary | ICD-10-CM | POA: Diagnosis not present

## 2017-10-25 DIAGNOSIS — M9901 Segmental and somatic dysfunction of cervical region: Secondary | ICD-10-CM | POA: Diagnosis not present

## 2017-10-25 DIAGNOSIS — M9902 Segmental and somatic dysfunction of thoracic region: Secondary | ICD-10-CM | POA: Diagnosis not present

## 2018-03-18 ENCOUNTER — Ambulatory Visit: Payer: 59 | Admitting: Psychiatry

## 2018-03-18 DIAGNOSIS — F4310 Post-traumatic stress disorder, unspecified: Secondary | ICD-10-CM
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
## 2018-03-28 ENCOUNTER — Ambulatory Visit: Payer: 59 | Admitting: Psychiatry

## 2018-03-28 DIAGNOSIS — F4310 Post-traumatic stress disorder, unspecified: Secondary | ICD-10-CM | POA: Diagnosis not present

## 2018-03-28 NOTE — Progress Notes (Signed)
      [facility] Counselor/Therapist Progress Note   Patient ID: Sherry Ewing, MRN: [id number]  Date: 03/28/2018  Timespent: 50 minutes  Treatment Type: Individual  Subjective: The client was upset this morning because she had to wait to sign in which made her late for this appointment.  She reports that she has had more conflict with her girlfriend.  Her girlfriend would call her during her work.  The client could not answer because she was on a call.  Then she is afraid the girlfriend gets upset with her because she does not answer the call.  We discussed with the client that the thinking errors involved were mind reading and interpretation of the other person.  I suggested to the client that she set ground rules with her girlfriend that "you will both assume that everything is okay until told otherwise."  The client is unsure if the girlfriend would accept this or not.  I asked the client to address this during the couples counseling with Dr. Kem Parkinson. The client agreed.  Interventions:Assertiveness Training, Psychosocial Skills: Boundaries and Supportive  Mental Status Exam:   Appearance:   Casual     Behavior:  Appropriate  Motor:  Normal  Speech/Language:   Clear and Coherent  Affect:  Appropriate  Mood:  anxious and irritable  Thought process:  Coherent  Thought content:    Logical  Perceptual disturbances:    Normal  Orientation:  Full (Time, Place, and Person)  Attention:  Good  Concentration:  good  Memory:  Immediate  Fund of knowledge:   Good  Insight:    Good  Judgment:   Good  Impulse Control:  good    Reported Symptoms: Agitation, anxious.  Risk Assessment: Danger to Self:  No Self-injurious Behavior: No Danger to Others: No Duty to Warn:no Physical Aggression / Violence:No  Access to Firearms a concern: No  Gang Involvement:No   Diagnosis:   ICD-10-CM   1. PTSD (post-traumatic stress disorder) F43.10      Plan: Review thinking errors,  setting ground rules with girlfriend.   Sherry Ewing, [place]

## 2018-03-31 ENCOUNTER — Ambulatory Visit: Payer: 59 | Admitting: Psychiatry

## 2018-04-01 ENCOUNTER — Encounter: Payer: Self-pay | Admitting: Psychiatry

## 2018-04-01 ENCOUNTER — Ambulatory Visit: Payer: 59 | Admitting: Psychiatry

## 2018-04-01 DIAGNOSIS — F329 Major depressive disorder, single episode, unspecified: Secondary | ICD-10-CM | POA: Diagnosis not present

## 2018-04-01 DIAGNOSIS — F32A Depression, unspecified: Secondary | ICD-10-CM | POA: Insufficient documentation

## 2018-04-01 DIAGNOSIS — F411 Generalized anxiety disorder: Secondary | ICD-10-CM | POA: Diagnosis not present

## 2018-04-01 MED ORDER — TEMAZEPAM 15 MG PO CAPS: 15.0000 mg | ORAL_CAPSULE | Freq: Every evening | ORAL | 0 refills | Status: DC | PRN

## 2018-04-01 MED ORDER — PAROXETINE HCL 20 MG PO TABS: 20.0000 mg | ORAL_TABLET | Freq: Every day | ORAL | 0 refills | Status: DC

## 2018-04-01 MED ORDER — ALPRAZOLAM 0.5 MG PO TABS: 0.5000 mg | ORAL_TABLET | Freq: Every evening | ORAL | 0 refills | Status: DC | PRN
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
## 2018-04-11 ENCOUNTER — Ambulatory Visit: Payer: 59 | Admitting: Psychiatry

## 2018-04-20 ENCOUNTER — Ambulatory Visit: Payer: 59 | Admitting: Psychiatry

## 2018-05-09 ENCOUNTER — Ambulatory Visit (INDEPENDENT_AMBULATORY_CARE_PROVIDER_SITE_OTHER): Payer: 59 | Admitting: Psychiatry

## 2018-05-09 ENCOUNTER — Encounter

## 2018-05-09 ENCOUNTER — Telehealth: Payer: Self-pay | Admitting: Psychiatry

## 2018-05-09 ENCOUNTER — Encounter: Payer: Self-pay | Admitting: Psychiatry

## 2018-05-09 ENCOUNTER — Ambulatory Visit: Payer: 59 | Admitting: Psychiatry

## 2018-05-09 DIAGNOSIS — F4310 Post-traumatic stress disorder, unspecified: Secondary | ICD-10-CM | POA: Diagnosis not present

## 2018-05-09 NOTE — Telephone Encounter (Signed)
Per conversation with Sherry Ewing and Sherry Ewing.Change Paxil to Wellbutrin

## 2018-05-09 NOTE — Progress Notes (Signed)
      [facility] Counselor/Therapist Progress Note  Patient ID: Sherry Ewing, MRN: [id number],    Date: 05/09/2018    5  Time Spent: 50 minutes   Treatment Type: Individual Therapy  Reported Symptoms: Anxious Mood  Mental Status Exam:  Appearance:   Casual     Behavior:  Appropriate  Motor:  Normal  Speech/Language:   Clear and Coherent  Affect:  Appropriate  Mood:  anxious and sad  Thought process:  normal  Thought content:    WNL  Sensory/Perceptual disturbances:    WNL  Orientation:  oriented to person, place and time/date  Attention:  Good  Concentration:  Good  Memory:  WNL  Fund of knowledge:   Good  Insight:    Good  Judgment:   Good  Impulse Control:  Good   Risk Assessment: Danger to Self:  No Self-injurious Behavior: No Danger to Others: No Duty to Warn:no Physical Aggression / Violence:No  Access to Firearms a concern: No  Gang Involvement:No   Subjective: The client reports that the physician's assistant had prescribed Paxil for her.  This has caused too many side effects as with other SSRIs.  The client wants to talk to the PA about possibly using an atypical antidepressant like Wellbutrin.  Her anxiety is still not well controlled and Huel Centola be she could use something longer acting such as Klonopin.  The client does admit that she has complaints difficulty because she forgets so much.  We have discussed about setting a timer to take her meds before she leaves the house.  She agreed to do so.  [place].  Her father is not doing well and continues to drink excessively.  She says he looks slightly jaundiced and is concerned that he is on a downward slide.  The client is more prepared for the passing of her father and she has been in the past.  She will try to spend time with family over the Thanksgiving holiday.  We also discussed using positive self talk to help improve her overall  self-concept.  Interventions: Cognitive Behavioral Therapy, Solution-Oriented/Positive Psychology, Eye Movement Desensitization and Reprocessing (EMDR) and Insight-Oriented  Diagnosis:   ICD-10-CM   1. PTSD (post-traumatic stress disorder) F43.10     Plan: Positive self talk, set timer for meds.  Gelene MinkFrederick Symeon Pule[place], [place]

## 2018-05-10 ENCOUNTER — Other Ambulatory Visit: Payer: Self-pay | Admitting: Family Medicine

## 2018-05-10 ENCOUNTER — Ambulatory Visit: Payer: 59 | Admitting: Psychiatry

## 2018-05-10 DIAGNOSIS — F909 Attention-deficit hyperactivity disorder, unspecified type: Secondary | ICD-10-CM | POA: Diagnosis not present

## 2018-05-10 DIAGNOSIS — F429 Obsessive-compulsive disorder, unspecified: Secondary | ICD-10-CM

## 2018-05-10 DIAGNOSIS — F32A Depression, unspecified: Secondary | ICD-10-CM

## 2018-05-10 DIAGNOSIS — G43809 Other migraine, not intractable, without status migrainosus: Secondary | ICD-10-CM

## 2018-05-10 DIAGNOSIS — F411 Generalized anxiety disorder: Secondary | ICD-10-CM

## 2018-05-10 DIAGNOSIS — F329 Major depressive disorder, single episode, unspecified: Secondary | ICD-10-CM

## 2018-05-10 MED ORDER — TEMAZEPAM 15 MG PO CAPS: ORAL_CAPSULE | ORAL | 0 refills | Status: DC

## 2018-05-10 MED ORDER — ALPRAZOLAM 0.5 MG PO TABS: 0.5000 mg | ORAL_TABLET | Freq: Every evening | ORAL | 0 refills | Status: DC | PRN

## 2018-05-10 MED ORDER — VORTIOXETINE HBR 5 MG PO TABS: 5.0000 mg | ORAL_TABLET | Freq: Every day | ORAL | 0 refills | Status: DC

## 2018-05-10 NOTE — Progress Notes (Signed)
[facility] Med Check  Patient ID: Sherry Ewing,  MRN: [id number]  PCP: Laurann MontanaWhite, Cynthia, MD  Date of Evaluation: 05/10/2018 Time spent:20 minutes  Chief Complaint:   HISTORY/CURRENT STATUS: HPI patient  initially seen 04/01/2018.  Diagnoses include anxiety and depression.  She was reluctant to start a medicine that she would have to take daily.  She did agree to take Paxil. Stopped Paxil 1 week ago due to restless sleep.Currently anxiety same and depression better. Works 7p-7a as Museum/gallery exhibitions officeremt. pcp writes Vyvanse.  Individual Medical History/ Review of Systems: Changes? :No   Allergies: Seroquel [quetiapine fumarate] and Tramadol hcl  Current Medications:  Current Outpatient Medications:  .  ALPRAZolam (XANAX) 0.5 MG tablet, Take 1 tablet (0.5 mg total) by mouth at bedtime as needed for anxiety., Disp: 30 tablet, Rfl: 0 .  ibuprofen (ADVIL,MOTRIN) 800 MG tablet, Take 1 tablet (800 mg total) by mouth every 8 (eight) hours as needed for moderate pain., Disp: 21 tablet, Rfl: 0 .  lisdexamfetamine (VYVANSE) 40 MG capsule, Take 40 mg by mouth every morning., Disp: , Rfl:  .  pantoprazole (PROTONIX) 40 MG tablet, Take 40 mg by mouth daily., Disp: , Rfl:  .  temazepam (RESTORIL) 15 MG capsule, 1-2 tabs daily., Disp: 60 capsule, Rfl: 0 .  HYDROcodone-acetaminophen (NORCO/VICODIN) 5-325 MG per tablet, Take 1-2 tablets by mouth every 6 (six) hours as needed for pain. (Patient not taking: Reported on 05/10/2018), Disp: 20 tablet, Rfl: 0 .  methocarbamol (ROBAXIN) 500 MG tablet, Take 1 tablet (500 mg total) by mouth every 8 (eight) hours as needed for muscle spasms. (Patient not taking: Reported on 05/10/2018), Disp: 20 tablet, Rfl: 0 .  norgestrel-ethinyl estradiol (LO/OVRAL) 0.3-30 MG-MCG per tablet, Take 1 tablet by mouth daily.  , Disp: , Rfl:  .  PARoxetine (PAXIL) 20 MG tablet, Take 1 tablet (20 mg total) by mouth daily. (Patient not taking: Reported on 04/01/2018), Disp: 30 tablet, Rfl: 0 .   vortioxetine HBr (TRINTELLIX) 5 MG TABS tablet, Take 1 tablet (5 mg total) by mouth daily., Disp: 30 tablet, Rfl: 0 Medication Side Effects: restless sleep and dreams when on Paxil. Family Medical/ Social History: Changes? no  MENTAL HEALTH EXAM:  There were no vitals taken for this visit.There is no height or weight on file to calculate BMI.  General Appearance: Casual  Eye Contact:  Good  Speech:  Clear and Coherent  Volume:  Normal  Mood:  Depressed  Affect:  Appropriate  Thought Process:  Linear  Orientation:  good  Thought Content: Logical   Suicidal Thoughts:  No  Homicidal Thoughts:  No  Memory:  WNL  Judgement:  Good  Insight:  Good  Psychomotor Activity:  Normal  Concentration:  Concentration: Good  Recall:  Good  Fund of Knowledge: Good  Language: Good  Assets:  Desire for Improvement  ADL's:  Intact  Cognition: WNL  Prognosis:  Good  Vital signs today 121/88 with pulse 81.  DIAGNOSES:    ICD-10-CM   1. Anxiety state F41.1   2. Depression, unspecified depression type F32.9   3. Obsessive-compulsive disorder, unspecified type F42.9   4. Attention deficit hyperactivity disorder (ADHD), unspecified ADHD type F90.9     Receiving Psychotherapy: Yes    RECOMMENDATIONS: start trintellix 5mg /day. Samples given Continue restoril 30mg  hs and xanax 0.5 mg 1-2/day. Pcp writes vyvanse.   Anne Fulay Vimal Derego, PA-C

## 2018-05-16 ENCOUNTER — Other Ambulatory Visit: Payer: Self-pay

## 2018-05-16 MED ORDER — ALPRAZOLAM 1 MG PO TABS: ORAL_TABLET | ORAL | 1 refills | Status: DC

## 2018-05-17 ENCOUNTER — Other Ambulatory Visit: Payer: Self-pay

## 2018-05-23 ENCOUNTER — Ambulatory Visit: Payer: 59 | Admitting: Psychiatry

## 2018-05-23 ENCOUNTER — Encounter: Payer: Self-pay | Admitting: Psychiatry

## 2018-05-23 DIAGNOSIS — F329 Major depressive disorder, single episode, unspecified: Secondary | ICD-10-CM

## 2018-05-23 DIAGNOSIS — F32A Depression, unspecified: Secondary | ICD-10-CM

## 2018-05-23 NOTE — Progress Notes (Signed)
      [facility] Counselor/Therapist Progress Note  Patient ID: Sherry Ewing, MRN: [id number],    Date: 05/23/2018   Time Spent: 50 minutes   Treatment Type: Individual Therapy  Reported Symptoms: Depressed mood and Anxious Mood  Mental Status Exam:  Appearance:   Casual and Well Groomed     Behavior:  Appropriate  Motor:  Normal  Speech/Language:   Clear and Coherent  Affect:  Appropriate  Mood:  anxious, irritable and sad  Thought process:  normal  Thought content:    WNL  Sensory/Perceptual disturbances:    WNL  Orientation:  oriented to person, place, time/date and situation  Attention:  Good  Concentration:  Good  Memory:  WNL  Fund of knowledge:   Good  Insight:    Good  Judgment:   Good  Impulse Control:  Good   Risk Assessment: Danger to Self:  No Self-injurious Behavior: No Danger to Others: No Duty to Warn:no Physical Aggression / Violence:No  Access to Firearms a concern: No  Gang Involvement:No   Subjective: Client states that she is very stressed today.  She has overspent with her credit cards and feels like she is going back into a financial hole.  We discussed what she can do now to help stem the tide.  The client will take her credit cards and tape them to a thread suspend them in a carton full of water and freeze it in her freezer.  This way she still has her cards but that she cannot easily access them until they are paid off.  The client will also evaluate her current car situation since she chronically seems to be spending money on it.  The client agrees  Today I used eye movement desensitization and reprocessing to help reduce the clients overall anxiety and stress.  She had had a fight with her girlfriend.  Her negative thought was, "I can be harsh and abrupt."  As the client processed many other issues came up for her.  She knows she has unresolved issues from her childhood with her mother and her father.  Currently her father continues to drink  excessively which will result in his death.  The client got very emotional at that point.  I reminded the client currently everything is okay and her father is not dead.  I pressed the client to stay in the present tense and do what works.  Think very practically what she can do today to make things improved.  The only behavior she can control is her own..  The clients anxiety started off at a subjective units of distress of 8.  It was less than 3 at the end of the session.  Interventions: Solution-Oriented/Positive Psychology, Eye Movement Desensitization and Reprocessing (EMDR) and Insight-Oriented  Diagnosis:   ICD-10-CM   1. Depression, unspecified depression type F32.9     Plan: Boundaries, self care.  Gelene MinkFrederick Sonya Pucci, [place]

## 2018-05-27 ENCOUNTER — Other Ambulatory Visit: Payer: Self-pay

## 2018-05-27 ENCOUNTER — Telehealth: Payer: Self-pay | Admitting: Psychiatry

## 2018-05-27 NOTE — Telephone Encounter (Signed)
Correct dose was ordered.

## 2018-05-27 NOTE — Telephone Encounter (Signed)
Pt called and said that the tonazapam you escribed is for the wrong dosage. It needs to be 30 mg. [facility]

## 2018-06-06 ENCOUNTER — Other Ambulatory Visit: Payer: Self-pay

## 2018-06-20 ENCOUNTER — Ambulatory Visit: Payer: 59 | Admitting: Psychiatry

## 2018-06-20 ENCOUNTER — Telehealth: Payer: Self-pay | Admitting: Psychiatry

## 2018-06-20 ENCOUNTER — Encounter: Payer: Self-pay | Admitting: Psychiatry

## 2018-06-20 ENCOUNTER — Other Ambulatory Visit: Payer: Self-pay | Admitting: Psychiatry

## 2018-06-20 DIAGNOSIS — F329 Major depressive disorder, single episode, unspecified: Secondary | ICD-10-CM | POA: Diagnosis not present

## 2018-06-20 MED ORDER — ALPRAZOLAM 0.5 MG PO TABS: ORAL_TABLET | ORAL | 0 refills | Status: DC

## 2018-06-20 NOTE — Telephone Encounter (Signed)
Pt. Called and said that the pharmacy is now able to get alpralam 0.5 mg in stock now. So p[lease send a new script [facility] the [facility] [address]

## 2018-06-20 NOTE — Progress Notes (Signed)
      [facility] Counselor/Therapist Progress Note  Patient ID: Sherry Ewing, MRN: [id number],    Date: 06/20/2018   Time Spent: 45 minutes   Treatment Type: Individual Therapy  Reported Symptoms: Anxious Mood  Mental Status Exam:  Appearance:   Casual and Well Groomed     Behavior:  Appropriate  Motor:  Normal  Speech/Language:   Clear and Coherent  Affect:  Appropriate  Mood:  anxious  Thought process:  normal  Thought content:    WNL  Sensory/Perceptual disturbances:    WNL  Orientation:  oriented to person, place, time/date and situation  Attention:  Good  Concentration:  Good  Memory:  WNL  Fund of knowledge:   Good  Insight:    Good  Judgment:   Good  Impulse Control:  Good   Risk Assessment: Danger to Self:  No Self-injurious Behavior: No Danger to Others: No Duty to Warn:no Physical Aggression / Violence:No  Access to Firearms a concern: No  Gang Involvement:No   Subjective: The client reports that she and her girlfriend, "had the best holidays ever!"  They are planning on going to [facility] in mid February.  They are going to take the girlfriend's children and the girlfriend sister. The client was surprised at how well things went over the holidays.  Upon further discussion the client was able to identify that she was #1 mindful of her mood, #2 aware of her tone of voice.  "I tried not to be abrasive and abrupt."  Today we used EMDR with the client targeting where the client thought her abrasiveness started.  "It started with my mom."  The negative thought was, "I am not wanted, I am inconvenient."  The client feels resentment and anger in her neck and back.  Client subjective units of distress was a 6+. As the client processed she became more resentful and tearful.  The client sees the connection between how her mother treated her and how she reacts to a women who treated her poorly.  This was a huge epiphany for the client.  At the end of the session her  subjective units of distress was less than 3.  The client agrees to live life more intentionally and pay attention to the relationships around her.  Interventions: Assertiveness/Communication, Solution-Oriented/Positive Psychology, Eye Movement Desensitization and Reprocessing (EMDR) and Insight-Oriented  Diagnosis:   ICD-10-CM   1. Reactive depression F32.9     Plan: Intentionality, self-care, positive self talk.  Gelene Mink Elese Rane, [place]

## 2018-06-20 NOTE — Progress Notes (Signed)
Refill request on xanax 0.5 qd to [facility]

## 2018-06-23 ENCOUNTER — Ambulatory Visit: Payer: 59 | Admitting: Psychiatry

## 2018-07-04 ENCOUNTER — Ambulatory Visit: Payer: 59 | Admitting: Psychiatry

## 2018-07-04 ENCOUNTER — Encounter: Payer: Self-pay | Admitting: Psychiatry

## 2018-07-04 DIAGNOSIS — F411 Generalized anxiety disorder: Secondary | ICD-10-CM | POA: Diagnosis not present

## 2018-07-04 NOTE — Progress Notes (Signed)
      [facility] Counselor/Therapist Progress Note  Patient ID: Patreece Costen, MRN: [id number],    Date: 07/04/2018  Time Spent: 46 minutes    Treatment Type: Individual Therapy  Reported Symptoms: Anxious Mood  Mental Status Exam:  Appearance:   Casual and Well Groomed     Behavior:  Appropriate  Motor:  Normal  Speech/Language:   Clear and Coherent  Affect:  Appropriate  Mood:  anxious  Thought process:  normal  Thought content:    WNL  Sensory/Perceptual disturbances:    WNL  Orientation:  oriented to person, place, time/date and situation  Attention:  Good  Concentration:  Good  Memory:  WNL  Fund of knowledge:   Good  Insight:    Good  Judgment:   Good  Impulse Control:  Good   Risk Assessment: Danger to Self:  No Self-injurious Behavior: No Danger to Others: No Duty to Warn:no Physical Aggression / Violence:No  Access to Firearms a concern: No  Gang Involvement:No   Subjective: The client states that she has been sick with a cold.  She has recently gotten a little bit better.  Today she is exasperated that her girlfriend and is so insecure.  She read through some text messages that detail her girlfriends negative narrative about the client. We discussed that trying to rebut the text messages the client should instead focus on her on her actions towards her girlfriend.  Make the effort to be kind and loving towards her unpredictably and unexpectedly.  The client agrees.  Interventions: Assertiveness/Communication, Solution-Oriented/Positive Psychology, Eye Movement Desensitization and Reprocessing (EMDR) and Insight-Oriented  Diagnosis:   ICD-10-CM   1. Generalized anxiety disorder F41.1     Plan: Assertiveness, boundaries.  Gelene Mink Cyra Spader, [place]

## 2018-07-13 ENCOUNTER — Ambulatory Visit: Payer: 59 | Admitting: Psychiatry

## 2018-07-18 ENCOUNTER — Ambulatory Visit: Payer: 59 | Admitting: Psychiatry

## 2018-08-10 ENCOUNTER — Other Ambulatory Visit: Payer: Self-pay | Admitting: Psychiatry

## 2018-08-15 ENCOUNTER — Encounter: Payer: Self-pay | Admitting: Psychiatry

## 2018-08-15 ENCOUNTER — Ambulatory Visit: Payer: 59 | Admitting: Psychiatry

## 2018-08-15 DIAGNOSIS — F411 Generalized anxiety disorder: Secondary | ICD-10-CM

## 2018-08-15 NOTE — Progress Notes (Signed)
      [facility] Counselor/Therapist Progress Note  Patient ID: Sherry Ewing, MRN: [id number],    Date: 08/15/2018  Time Spent: 45 minutes   Treatment Type: Individual Therapy  Reported Symptoms: Anxious, stressed, irritable  Mental Status Exam:  Appearance:   Casual and Well Groomed     Behavior:  Appropriate  Motor:  Normal  Speech/Language:   Clear and Coherent  Affect:  Appropriate  Mood:  anxious and irritable  Thought process:  normal  Thought content:    WNL  Sensory/Perceptual disturbances:    WNL  Orientation:  oriented to person, place, time/date and situation  Attention:  Good  Concentration:  Good  Memory:  WNL  Fund of knowledge:   Good  Insight:    Good  Judgment:   Good  Impulse Control:  Good   Risk Assessment: Danger to Self:  No Self-injurious Behavior: No Danger to Others: No Duty to Warn:no Physical Aggression / Violence:No  Access to Firearms a concern: No  Gang Involvement:No   Subjective: The client states that her trip to [place] world with her girlfriend and her girlfriend's children went well.  They had 2 weeks together and were fairly relaxed.  The problems returned when they were back in [place].  The client states she got tired of coming over to the girlfriends house to spend the night only to find at her girlfriend's daughter was sleeping in the bed with her.  The client, and rightly so, was not comfortable with the daughter sleeping in the same room with him.  The client's girlfriend took offense and then communication broke down between both of them. Today we discussed the thinking errors such as mind reading, fortune telling, and interpreting.  All these are leading to bad outcomes.  The client feels her girlfriend has the expectation that she is always done something wrong. We discussed using the clarification model as a way to clear up their communication.  Client is unsure that this will be helpful.  She will try to see if writing  would be more helpful.  Interventions: Solution-Oriented/Positive Psychology, Psycho-education/Bibliotherapy and Insight-Oriented  Diagnosis:   ICD-10-CM   1. Generalized anxiety disorder F41.1     Plan: Letter, clarification model, positive self talk.  This record has been created using [employer].  Chart creation errors have been sought, but Kila Godina not always have been located and corrected. Such creation errors do not reflect on the standard of medical care.   Evianna Chandran, [place]

## 2018-08-19 ENCOUNTER — Ambulatory Visit: Payer: 59 | Admitting: Psychiatry

## 2018-08-24 ENCOUNTER — Ambulatory Visit: Payer: 59 | Admitting: Psychiatry

## 2018-08-24 ENCOUNTER — Other Ambulatory Visit: Payer: Self-pay

## 2018-08-24 DIAGNOSIS — F32A Depression, unspecified: Secondary | ICD-10-CM

## 2018-08-24 DIAGNOSIS — F329 Major depressive disorder, single episode, unspecified: Secondary | ICD-10-CM

## 2018-08-24 MED ORDER — TEMAZEPAM 15 MG PO CAPS: ORAL_CAPSULE | ORAL | 0 refills | Status: DC

## 2018-08-24 MED ORDER — VORTIOXETINE HBR 10 MG PO TABS: ORAL_TABLET | ORAL | 2 refills | Status: DC

## 2018-08-24 MED ORDER — LISDEXAMFETAMINE DIMESYLATE 40 MG PO CAPS: ORAL_CAPSULE | ORAL | 0 refills | Status: DC

## 2018-08-24 NOTE — Progress Notes (Signed)
[facility] Med Check  Patient ID: Sherry Ewing,  MRN: [id number]  PCP: Laurann Montana, MD  Date of Evaluation: 08/24/2018 Time spent:30 minutes  Chief Complaint:   HISTORY/CURRENT STATUS: HPI patient last seen 05/10/2018 her anxiety was about the same depression was somewhat better.  She had restless sleep so she stopped the Paxil.  I had her start Trintellix.  Overall she is about the same he is tired she has anxiety about the police officer died last year.  She has general anxiety from work with 2 panic attacks a week also some chest tightness.  Manic he talks more grandiose no sleep is better goal oriented several days.  Irritable moods can last a day.  Individual Medical History/ Review of Systems: Changes? :No   Allergies: Seroquel [quetiapine fumarate] and Tramadol hcl  Current Medications:  Current Outpatient Medications:  .  ALPRAZolam (XANAX) 0.5 MG tablet, TAKE 1 TO 2 TABLETS AS NEEDED FOR ANXIETY. LIMIT ONCE IN A DAY, Disp: 40 tablet, Rfl: 0 .  HYDROcodone-acetaminophen (NORCO/VICODIN) 5-325 MG per tablet, Take 1-2 tablets by mouth every 6 (six) hours as needed for pain. (Patient not taking: Reported on 05/10/2018), Disp: 20 tablet, Rfl: 0 .  ibuprofen (ADVIL,MOTRIN) 800 MG tablet, Take 1 tablet (800 mg total) by mouth every 8 (eight) hours as needed for moderate pain., Disp: 21 tablet, Rfl: 0 .  lisdexamfetamine (VYVANSE) 40 MG capsule, Take 40 mg by mouth every morning., Disp: , Rfl:  .  methocarbamol (ROBAXIN) 500 MG tablet, Take 1 tablet (500 mg total) by mouth every 8 (eight) hours as needed for muscle spasms. (Patient not taking: Reported on 05/10/2018), Disp: 20 tablet, Rfl: 0 .  norgestrel-ethinyl estradiol (LO/OVRAL) 0.3-30 MG-MCG per tablet, Take 1 tablet by mouth daily.  , Disp: , Rfl:  .  pantoprazole (PROTONIX) 40 MG tablet, Take 40 mg by mouth daily., Disp: , Rfl:  .  PARoxetine (PAXIL) 20 MG tablet, Take 1 tablet (20 mg total) by mouth daily. (Patient not  taking: Reported on 04/01/2018), Disp: 30 tablet, Rfl: 0 .  temazepam (RESTORIL) 15 MG capsule, TAKE 1 TO 2 CAPSULES BY MOUTH EVERY DAY, Disp: 60 capsule, Rfl: 0 .  vortioxetine HBr (TRINTELLIX) 5 MG TABS tablet, Take 1 tablet (5 mg total) by mouth daily., Disp: 30 tablet, Rfl: 0 Medication Side Effects: none  Family Medical/ Social History: Changes? No  MENTAL HEALTH EXAM:  There were no vitals taken for this visit.There is no height or weight on file to calculate BMI.  General Appearance: Casual  Eye Contact:  Good  Speech:  Normal Rate  Volume:  Normal  Mood:  Depressed  Affect:  Appropriate  Thought Process:  Linear  Orientation:  Full (Time, Place, and Person)  Thought Content: Logical   Suicidal Thoughts:  No  Homicidal Thoughts:  No  Memory:  WNL  Judgement:  Good  Insight:  Good  Psychomotor Activity:  Normal  Concentration:  Concentration: Good  Recall:  Good  Fund of Knowledge: Good  Language: Good  Assets:  Desire for Improvement  ADL's:  Intact  Cognition: WNL  Prognosis:  Fair    DIAGNOSES: No diagnosis found.  Receiving Psychotherapy: Yes    RECOMMENDATIONS: We will need to keep medications the same.  We will follow her for possible manic episodes.  We will also follow her panic attacks and have chest tightness.  Increase her Trintellix to 10 mg a day and she can increase this usually she can take her Restoril and  her Xanax as needed.  Recheck 1 month.   Anne Fu, PA-C

## 2018-08-25 ENCOUNTER — Ambulatory Visit: Payer: 59 | Admitting: Psychiatry

## 2018-08-29 ENCOUNTER — Ambulatory Visit: Payer: 59 | Admitting: Psychiatry

## 2018-08-30 ENCOUNTER — Telehealth: Payer: Self-pay | Admitting: Psychiatry

## 2018-08-30 ENCOUNTER — Other Ambulatory Visit: Payer: Self-pay

## 2018-08-30 MED ORDER — TEMAZEPAM 15 MG PO CAPS: ORAL_CAPSULE | ORAL | 1 refills | Status: DC

## 2018-08-30 MED ORDER — VORTIOXETINE HBR 10 MG PO TABS: ORAL_TABLET | ORAL | 2 refills | Status: DC

## 2018-08-30 NOTE — Telephone Encounter (Signed)
Pt called. Needs refill on Trintellix and [facility]il [facility]  [address] longer go to [facility].

## 2018-08-30 NOTE — Telephone Encounter (Signed)
rx [facility]

## 2018-09-05 ENCOUNTER — Other Ambulatory Visit: Payer: Self-pay

## 2018-09-05 ENCOUNTER — Telehealth: Payer: Self-pay | Admitting: Psychiatry

## 2018-09-05 MED ORDER — ALPRAZOLAM 0.5 MG PO TABS: ORAL_TABLET | ORAL | 0 refills | Status: DC

## 2018-09-05 MED ORDER — VORTIOXETINE HBR 10 MG PO TABS: ORAL_TABLET | ORAL | 2 refills | Status: DC

## 2018-09-05 MED ORDER — TEMAZEPAM 15 MG PO CAPS: ORAL_CAPSULE | ORAL | 1 refills | Status: DC

## 2018-09-05 NOTE — Telephone Encounter (Signed)
Pt needs refills for Restoril, Trintellix ,Xanax [facility] [address] Please Delete [facility].

## 2018-09-05 NOTE — Telephone Encounter (Signed)
Cancel refills at [facility] from 03/17 for Trintellix and Restoril. Will submit all the medications [facility]

## 2018-09-12 ENCOUNTER — Ambulatory Visit: Payer: Self-pay | Admitting: Psychiatry

## 2018-09-12 ENCOUNTER — Other Ambulatory Visit: Payer: Self-pay

## 2018-09-21 ENCOUNTER — Ambulatory Visit: Payer: 59 | Admitting: Psychiatry

## 2018-09-26 ENCOUNTER — Other Ambulatory Visit: Payer: Self-pay

## 2018-09-26 ENCOUNTER — Ambulatory Visit: Payer: 59 | Admitting: Psychiatry

## 2018-10-03 ENCOUNTER — Other Ambulatory Visit: Payer: Self-pay | Admitting: Psychiatry

## 2018-10-04 NOTE — Telephone Encounter (Signed)
Last fill 03/23 Last visit 03/11 with Mat Carne

## 2018-10-19 ENCOUNTER — Ambulatory Visit: Payer: 59 | Admitting: Psychiatry

## 2018-10-19 ENCOUNTER — Other Ambulatory Visit: Payer: Self-pay

## 2018-10-24 ENCOUNTER — Ambulatory Visit: Payer: 59 | Admitting: Psychiatry

## 2018-10-28 ENCOUNTER — Ambulatory Visit: Payer: 59 | Admitting: Psychiatry

## 2018-11-02 ENCOUNTER — Other Ambulatory Visit: Payer: Self-pay | Admitting: Physician Assistant

## 2018-11-04 NOTE — Telephone Encounter (Signed)
Sent message for front office to set up appt with new provider. Last visit 08/2018 with Mat Carne

## 2018-11-16 ENCOUNTER — Encounter: Payer: Self-pay | Admitting: Psychiatry

## 2018-11-16 ENCOUNTER — Ambulatory Visit: Payer: 59 | Admitting: Psychiatry

## 2018-11-16 ENCOUNTER — Other Ambulatory Visit: Payer: Self-pay

## 2018-11-16 DIAGNOSIS — F329 Major depressive disorder, single episode, unspecified: Secondary | ICD-10-CM

## 2018-11-16 DIAGNOSIS — F32A Depression, unspecified: Secondary | ICD-10-CM

## 2018-11-16 NOTE — Progress Notes (Signed)
[facility] Counselor/Therapist Progress Note  Patient ID: Sherry Ewing, MRN: [id number],    Date: 11/16/2018  Time Spent: 50 minutes   Treatment Type: Individual Therapy  Reported Symptoms: grief, sadness, anxiety, tearfulness.  Mental Status Exam:  Appearance:   Casual and Well Groomed     Behavior:  Appropriate  Motor:  Normal  Speech/Language:   Clear and Coherent  Affect:  Appropriate  Mood:  anxious, sad and grief  Thought process:  normal  Thought content:    WNL  Sensory/Perceptual disturbances:    WNL  Orientation:  oriented to person, place, time/date and situation  Attention:  Good  Concentration:  Good  Memory:  WNL  Fund of knowledge:   Good  Insight:    Good  Judgment:   Good  Impulse Control:  Good   Risk Assessment: Danger to Self:  No Self-injurious Behavior: No Danger to Others: No Duty to Warn:no Physical Aggression / Violence:No  Access to Firearms a concern: No  Gang Involvement:No   Subjective: I met with the client face-to-face today.  We both had facemasks. The client reports that her dad died on 11-18-2022 at [place], [place].  Starting back in mid March client had planned on taking her mother to a hockey game in [place], [place] for her birthday.  Due to the COVID-19 pandemic the hockey game was canceled.  The client states she ended up spending the weekend with her mom in [place].  She states her mother returned to the beach to where her father was.  She states her mom was going to the grocery store and her father wanted her to take him to the [facility] store.  Her father fell in the parking lot of the [facility] store.  EMS was called and the client's father was admitted to the hospital in [facility].  During his stay there the admitting doctor told the client's mother that the client's father was a danger to himself.  The client's mother was able to get medical power of attorney. Ultimately her father was discharged to an  inpatient rehabilitation nursing facility.  "It was not a great place."  The client knew that her mother could not handle her father and so he had to stay at the rehab.  Due to to the pandemic no one was allowed in or out at the rehab.  So the client could not see her father.  She became angry when she could not get a hold of any of the physicians.  When she did contact them she did not feel like it was helpful.  Ultimately they were able to call hospice in and get her father discharged to his home.  The client spent 2 weeks caring for her dad along with a dear family friend and her mom.  When the client's father passed the time of death was 10:19 AM.  The client was blown away because 10/19 is her oldest brothers birthday who passed away 8 years ago. The client's mother is currently staying with the client at her house here in [place].  She states her mother does not want to return to the [place], [place] on her own.  The client was very tearful and sad through the course of the session.  I used the bilateral stimulation hand paddles with the client to help her begin to process through the deep grief and pain that she feels.  The client is very angry with her father  since all of this could have been prevented if he had stopped his drinking years ago.  The client is also frustrated with her mother who seems helpless to take action.  We discussed that there is only so much the client can do.  She needs to care for herself.  Allowing herself time to sleep and freely grieve the death of her father.  Interventions: Motivational Interviewing, Solution-Oriented/Positive Psychology, Grief Therapy, Eye Movement Desensitization and Reprocessing (EMDR) and Insight-Oriented  Diagnosis:   ICD-10-CM   1. Depression, unspecified depression type F32.9     Plan: follow up with hospice for grief group, self care, positive self talk, write.  This record has been created using [employer].  Chart creation  errors have been sought, but Clayborn Milnes not always have been located and corrected. Such creation errors do not reflect on the standard of medical care.  Nolan Lasser, [facility]

## 2018-11-17 ENCOUNTER — Other Ambulatory Visit: Payer: Self-pay

## 2018-11-17 MED ORDER — TEMAZEPAM 15 MG PO CAPS: ORAL_CAPSULE | ORAL | 1 refills | Status: DC

## 2018-11-21 ENCOUNTER — Telehealth: Payer: Self-pay | Admitting: Psychiatry

## 2018-11-21 ENCOUNTER — Other Ambulatory Visit: Payer: Self-pay

## 2018-11-21 ENCOUNTER — Encounter: Payer: Self-pay | Admitting: Psychiatry

## 2018-11-21 ENCOUNTER — Ambulatory Visit: Payer: 59 | Admitting: Psychiatry

## 2018-11-21 DIAGNOSIS — F329 Major depressive disorder, single episode, unspecified: Secondary | ICD-10-CM

## 2018-11-21 DIAGNOSIS — F32A Depression, unspecified: Secondary | ICD-10-CM

## 2018-11-21 MED ORDER — TEMAZEPAM 15 MG PO CAPS: ORAL_CAPSULE | ORAL | 1 refills | Status: DC

## 2018-11-21 NOTE — Telephone Encounter (Signed)
Refills cxl at [facility] and called into [facility]

## 2018-11-21 NOTE — Telephone Encounter (Signed)
Change refills for Temazepam to [facility]. Was sent to pharmacy out of town when she was there during her father's funeral.

## 2018-11-21 NOTE — Telephone Encounter (Signed)
[facility] patient need script for [facility] [address], [facility] in [facility] bc that's where patient was for father's funeral

## 2018-11-21 NOTE — Progress Notes (Signed)
      [facility] Counselor/Therapist Progress Note  Patient ID: Sherry Ewing, MRN: [id number],    Date: 11/21/2018  Time Spent: 50 minutes   Treatment Type: Individual Therapy  Reported Symptoms: anger, sadness, anxiety  Mental Status Exam:  Appearance:   Casual and Well Groomed     Behavior:  Appropriate  Motor:  Normal  Speech/Language:   Clear and Coherent  Affect:  Depressed and Tearful  Mood:  angry, anxious, depressed and sad  Thought process:  normal  Thought content:    WNL  Sensory/Perceptual disturbances:    WNL  Orientation:  oriented to person, place, time/date and situation  Attention:  Good  Concentration:  Good  Memory:  WNL  Fund of knowledge:   Good  Insight:    Good  Judgment:   Good  Impulse Control:  Good   Risk Assessment: Danger to Self:  No Self-injurious Behavior: No Danger to Others: No Duty to Warn:no Physical Aggression / Violence:No  Access to Firearms a concern: No  Gang Involvement:No   Subjective: I met with the client face-to-face today.  We both had facemasks. "I am angry I am stuck with my mom.  I am angry my dad checked out after my brother."  The client was very tearful through the course of her session and angry as well.  She has had thoughts of death but no suicidal ideation.  The client has had a poor relationship with her mother for years.  She was well connected to her father, her grandfather, and her brother.  Her brother died in a hunting accident 8 years ago.  Her grandfather died last year.  Her father died within the last month. We discussed what the client could do to help continue to process her grief and anger.  She had started doing some mild exercise such as walking daily.  She has found this helpful.  She also is considering going to the shooting range as another outlet.  We talked about other physical ways she could express her anger such as hitting the ground with a baseball bat or chopping wood. The client is  currently out on FMLA.  She hopes to return to work soon.  She will continue with being assertive with her girlfriend and setting appropriate boundaries with her mother.  I used the bilateral stimulation hand paddles with the client.  Her subjective units of distress went from a 7 to less than 3 at the end of the session.  Interventions: Motivational Interviewing, Solution-Oriented/Positive Psychology, Eye Movement Desensitization and Reprocessing (EMDR) and Insight-Oriented  Diagnosis:   ICD-10-CM   1. Depression, unspecified depression type F32.9     Plan: exercise, self care, anger expression, positive self talk, grief work.  This record has been created using Dragon software.  Chart creation errors have been sought, but may not always have been located and corrected. Such creation errors do not reflect on the standard of medical care.  Frederick May, LCMHCS                  

## 2018-11-30 ENCOUNTER — Other Ambulatory Visit: Payer: Self-pay

## 2018-11-30 ENCOUNTER — Other Ambulatory Visit: Payer: Self-pay | Admitting: Physician Assistant

## 2018-11-30 ENCOUNTER — Ambulatory Visit (INDEPENDENT_AMBULATORY_CARE_PROVIDER_SITE_OTHER): Payer: 59 | Admitting: Psychiatry

## 2018-11-30 ENCOUNTER — Encounter: Payer: Self-pay | Admitting: Psychiatry

## 2018-11-30 DIAGNOSIS — F411 Generalized anxiety disorder: Secondary | ICD-10-CM

## 2018-11-30 DIAGNOSIS — F4321 Adjustment disorder with depressed mood: Secondary | ICD-10-CM | POA: Diagnosis not present

## 2018-11-30 NOTE — Progress Notes (Signed)
[facility] Counselor/Therapist Progress Note  Patient ID: Sherry Ewing, MRN: [id number],    Date: 11/30/2018  Time Spent: 50 minutes   Treatment Type: Individual Therapy  Reported Symptoms: anxious, sad, angry.  Mental Status Exam:  Appearance:   Casual and Well Groomed     Behavior:  Appropriate  Motor:  Normal  Speech/Language:   Garbled  Affect:  Depressed and Tearful  Mood:  angry, anxious, depressed, irritable and sad  Thought process:  normal  Thought content:    WNL  Sensory/Perceptual disturbances:    WNL  Orientation:  oriented to person, place, time/date and situation  Attention:  Good  Concentration:  Good  Memory:  WNL  Fund of knowledge:   Good  Insight:    Good  Judgment:   Good  Impulse Control:  Good   Risk Assessment: Danger to Self:  No Self-injurious Behavior: No Danger to Others: No Duty to Warn:no Physical Aggression / Violence:No  Access to Firearms a concern: No  Gang Involvement:No   Subjective: I met with the client face-to-face.  We both had facemasks. The client comes in today very tearful and sad at times sobbing uncontrollably.  It was hard to make out what she was saying at times.  Recently the client's father had unexpectedly died.  This is been a blow to the client.  7 years ago her older brother died in a freak hunting accident.  She has a poor relationship with her mother and now feels abandoned and stuck with her mom. Today the client reports that her girlfriend of 4 years has broken up with her.  The conflict comes over the girlfriend stating that the client is demanding too much.  The client has been distraught and tearful over the death of her father less than a month ago.  She has wanted some comfort and understanding but was not asking her girlfriend to fix it.  The client showed a string of texts that they had exchanged back-and-forth.  It was clear there was some serious communication issues.  The girlfriend would interpret  the client's behavior and the most negative way possible.  And then the reason for the break-up was never quite clear.  The client is devastated and feels severe abandonment and loss. I used eye-movement today with the client with the hope of reducing the level of sadness and tearfulness that she was experiencing.  When she first came in she was sobbing uncontrollably.  At the end of the session she was somber but not crying at all.  I pointed out to the client how poor the communication was between her and her girlfriend.  I encouraged the client to write out her thoughts and feelings to her girlfriend in a letter but not to send it.  This would be a way of her processing through her feelings.  I also advised her to give her girlfriend some time to reevaluate.  The client is unsure if this will work but is willing to try.  Interventions: Assertiveness/Communication, Motivational Interviewing, Solution-Oriented/Positive Psychology, [employer] Group Desensitization and Reprocessing (EMDR) and Insight-Oriented  Diagnosis:   ICD-10-CM   1. Generalized anxiety disorder  F41.1   2. Adjustment disorder with depressed mood  F43.21     Plan: Letter writing, self-care, positive self talk, radical acceptance.  This record has been created using [employer].  Chart creation errors have been sought, but Jobina Maita not always have been located and corrected. Such creation errors do not reflect  on the standard of medical care.  Ernest Popowski, [facility]

## 2018-12-02 ENCOUNTER — Ambulatory Visit: Payer: 59 | Admitting: Psychiatry

## 2018-12-02 ENCOUNTER — Other Ambulatory Visit: Payer: Self-pay

## 2018-12-02 ENCOUNTER — Encounter: Payer: Self-pay | Admitting: Psychiatry

## 2018-12-02 DIAGNOSIS — F411 Generalized anxiety disorder: Secondary | ICD-10-CM | POA: Diagnosis not present

## 2018-12-02 DIAGNOSIS — F4321 Adjustment disorder with depressed mood: Secondary | ICD-10-CM | POA: Diagnosis not present

## 2018-12-02 NOTE — Progress Notes (Signed)
      [facility] Counselor/Therapist Progress Note  Patient ID: Sherry Ewing, MRN: [id number],    Date: 12/02/2018  Time Spent: 50 minutes   Treatment Type: Individual Therapy  Reported Symptoms: tearful, sad, anxious  Mental Status Exam:  Appearance:   Casual and Well Groomed     Behavior:  Appropriate  Motor:  Normal  Speech/Language:   Garbled and Slow  Affect:  Depressed, Tearful and sobbing  Mood:  anxious and sad  Thought process:  normal  Thought content:    WNL  Sensory/Perceptual disturbances:    WNL  Orientation:  oriented to person, place, time/date and situation  Attention:  Good  Concentration:  Good  Memory:  WNL  Fund of knowledge:   Good  Insight:    Good  Judgment:   Good  Impulse Control:  Good   Risk Assessment: Danger to Self:  No Self-injurious Behavior: No Danger to Others: No Duty to Warn:no Physical Aggression / Violence:No  Access to Firearms a concern: No  Gang Involvement:No   Subjective: I met with the client face-to-face.  We both had facemasks. The client came in distraught again today.  She states that she is still very upset over the break-up with her girlfriend.  She had tried to text her but has gotten no response.  "I do not want to be alone.  I do not know how to fix it and get her back." The client was very tearful at times sobbing uncontrollably.  It was hard to understand what she would say at times.  I used the bilateral stimulation hand paddles with the client.  As the session went on her crying stopped and she became more somber but was listening.  I explained to the client that the dynamics between her and her girlfriend resulted in some many power struggles that no one won.  This was not productive.  Her girlfriend looks through the lens that she is going to be rejected or betrayed.  Discussed with the client what does her girlfriend need?  She responded "to know she is important."  I continued to discuss with the client that  if she continues to fall apart she will not be prepared to be back in relationship if it comes her way.  I discussed the need for her to be mindful, start exercising by walking daily and spending time outside of her house and distracting herself with other activities.  I suggested to the client that she write a simple note and mail it to her girlfriend each day.  The note should be something positive and nothing combative.  This is a way for the client to express her feelings in a positive way that Sherry Ewing result in some good.  The client agreed to try.  She denies any suicidal thoughts.  Interventions: Assertiveness/Communication, Mindfulness Meditation, Motivational Interviewing, Solution-Oriented/Positive Psychology, [employer] Group Desensitization and Reprocessing (EMDR) and Insight-Oriented  Diagnosis:   ICD-10-CM   1. Generalized anxiety disorder  F41.1   2. Adjustment disorder with depressed mood  F43.21     Plan: Mindfulness, write notes, assertiveness, daily exercise through walking, distracting herself with positive activities.  This record has been created using [employer].  Chart creation errors have been sought, but Sherry Ewing not always have been located and corrected. Such creation errors do not reflect on the standard of medical care.  Sherry Ewing, [facility]

## 2018-12-05 ENCOUNTER — Other Ambulatory Visit: Payer: Self-pay

## 2018-12-05 ENCOUNTER — Encounter: Payer: Self-pay | Admitting: Psychiatry

## 2018-12-05 ENCOUNTER — Ambulatory Visit: Payer: 59 | Admitting: Psychiatry

## 2018-12-05 DIAGNOSIS — F411 Generalized anxiety disorder: Secondary | ICD-10-CM

## 2018-12-05 DIAGNOSIS — F4321 Adjustment disorder with depressed mood: Secondary | ICD-10-CM | POA: Diagnosis not present

## 2018-12-05 NOTE — Progress Notes (Signed)
      [facility] Counselor/Therapist Progress Note  Patient ID: Sherry Ewing, MRN: [id number],    Date: 12/05/2018  Time Spent: 50 minutes   Treatment Type: Individual Therapy  Reported Symptoms: anxiety, sad, tearful.  Mental Status Exam:  Appearance:   Casual and Well Groomed     Behavior:  Appropriate  Motor:  Normal  Speech/Language:   Clear and Coherent  Affect:  Tearful  Mood:  anxious and sad  Thought process:  normal  Thought content:    WNL  Sensory/Perceptual disturbances:    WNL  Orientation:  oriented to person, place, time/date and situation  Attention:  Good  Concentration:  Good  Memory:  WNL  Fund of knowledge:   Good  Insight:    Good  Judgment:   Good  Impulse Control:  Good   Risk Assessment: Danger to Self:  No Self-injurious Behavior: No Danger to Others: No Duty to Warn:no Physical Aggression / Violence:No  Access to Firearms a concern: No  Gang Involvement:No   Subjective: I met with the client face-to-face.  We both had masks. "I message my girlfriend over the weekend.  She told me to stop."  The client had been writing notes and mailing them to her girlfriend.  Today she was more tearful and distraught because she felt like maybe it really was over with her girlfriend.  The client brought in a 3 page letter she had written to her girlfriend.  It was nicely reflective where she admitted where she was wrong.  The client described her lack of communication.  She described an unwillingness to share information especially her feelings.  She knows this is what her girlfriend does need.  "I wait too long to do things."  I used eye-movement with the client as she had these insights to hopefully help shift her beliefs.  We are working towards a goal of her being more transparent and learning to trust her judgment more.  I explained that in the relationship like this is going to involve risk.  I encouraged the client to go ahead and send the letter to let  the chips fall as they Takao Lizer.  The client agrees.  I pointed out that no matter what happens she has gained more insight and understanding about herself.  Interventions: Assertiveness/Communication, Motivational Interviewing, Solution-Oriented/Positive Psychology, [employer] Group Desensitization and Reprocessing (EMDR) and Insight-Oriented  Diagnosis:   ICD-10-CM   1. Generalized anxiety disorder  F41.1   2. Adjustment disorder with depressed mood  F43.21     Plan: Client will send letter, continue to journal, increase her self-care.  This record has been created using [employer].  Chart creation errors have been sought, but Reality Dejonge not always have been located and corrected. Such creation errors do not reflect on the standard of medical care.  Preciosa Bundrick, [facility]

## 2018-12-14 ENCOUNTER — Other Ambulatory Visit: Payer: Self-pay

## 2018-12-14 ENCOUNTER — Encounter: Payer: Self-pay | Admitting: Psychiatry

## 2018-12-14 ENCOUNTER — Ambulatory Visit: Payer: 59 | Admitting: Psychiatry

## 2018-12-14 DIAGNOSIS — F411 Generalized anxiety disorder: Secondary | ICD-10-CM | POA: Diagnosis not present

## 2018-12-14 DIAGNOSIS — F4321 Adjustment disorder with depressed mood: Secondary | ICD-10-CM

## 2018-12-14 NOTE — Progress Notes (Signed)
      [facility] Counselor/Therapist Progress Note  Patient ID: Sherry Ewing, MRN: [id number],    Date: 12/14/2018  Time Spent: 50 minutes   Treatment Type: Individual Therapy  Reported Symptoms: anxious, sad, tearful,   Mental Status Exam:  Appearance:   Casual and Well Groomed     Behavior:  Agitated  Motor:  Normal  Speech/Language:   Garbled  Affect:  Depressed and Tearful  Mood:  anxious, depressed and sad  Thought process:  normal  Thought content:    Rumination  Sensory/Perceptual disturbances:    WNL  Orientation:  oriented to person, place, time/date and situation  Attention:  Good  Concentration:  Good  Memory:  WNL  Fund of knowledge:   Good  Insight:    Good  Judgment:   Good  Impulse Control:  Good   Risk Assessment: Danger to Self:  No Self-injurious Behavior: No Danger to Others: No Duty to Warn:no Physical Aggression / Violence:No  Access to Firearms a concern: No  Gang Involvement:No   Subjective: I met with the client face-to-face.  We both had facemasks. The client reports today that her ex-girlfriend told her to quit messaging her" writing notes to her.  "She is never coming back."  The client at this point was sobbing uncontrollably.  Her speech was garbled and difficult to understand.  She stated, "I miss my dad and her.  She has been walking she states to "exhaustion". I asked the client to control her crying so we could work on her issues.  I suggested that she get a blank journal and write letters and it to her girlfriend so if they do reconcile in a few months she will have that information to give her.  It also gives her good outlet to put her feelings on paper. I gave the client mindfulness exercises to practice.  I also went through mindful breathing with the client.  I asked her to practice these daily to help control her thought process and stay in the present tense versus catastrophize them.  One of the things she could do to be mindful is  to sing.  The client will consider this as well.  Her subjective units of distress went from a 10+ to a 3 at the end of the session.  I used the bilateral stimulation hand paddles through the course of the session.  The client did well.  Interventions: Mindfulness Meditation, Motivational Interviewing, Solution-Oriented/Positive Psychology, [employer] Group Desensitization and Reprocessing (EMDR) and Insight-Oriented  Diagnosis:   ICD-10-CM   1. Generalized anxiety disorder  F41.1   2. Adjustment disorder with depressed mood  F43.21     Plan: Mindfulness exercises, deep breathing, exercise, journaling.  This record has been created using [employer].  Chart creation errors have been sought, but Shaquna Geigle not always have been located and corrected. Such creation errors do not reflect on the standard of medical care.  Edmond Ginsberg, [facility]

## 2018-12-16 ENCOUNTER — Other Ambulatory Visit: Payer: Self-pay | Admitting: Physician Assistant

## 2018-12-18 NOTE — Telephone Encounter (Signed)
It's not been 30 days since last fill, 06/18

## 2018-12-20 ENCOUNTER — Encounter: Payer: Self-pay | Admitting: Psychiatry

## 2018-12-20 ENCOUNTER — Other Ambulatory Visit: Payer: Self-pay | Admitting: Physician Assistant

## 2018-12-20 ENCOUNTER — Ambulatory Visit: Payer: 59 | Admitting: Psychiatry

## 2018-12-20 ENCOUNTER — Other Ambulatory Visit: Payer: Self-pay

## 2018-12-20 DIAGNOSIS — F4321 Adjustment disorder with depressed mood: Secondary | ICD-10-CM | POA: Diagnosis not present

## 2018-12-20 DIAGNOSIS — F411 Generalized anxiety disorder: Secondary | ICD-10-CM

## 2018-12-20 NOTE — Progress Notes (Signed)
[facility] Counselor/Therapist Progress Note  Patient ID: Sherry Ewing, MRN: [id number],    Date: 12/20/2018  Time Spent: 50 minutes  Treatment Type: Individual Therapy  Reported Symptoms: Tearfulness, anxiety, sadness.  Mental Status Exam:  Appearance:   Casual and Well Groomed     Behavior:  Agitated  Motor:  Sobbing  Speech/Language:   Garbled  Affect:  Tearful  Mood:  anxious and sad  Thought process:  normal  Thought content:    WNL  Sensory/Perceptual disturbances:    WNL  Orientation:  oriented to person, place, time/date and situation  Attention:  Good  Concentration:  Good  Memory:  WNL  Fund of knowledge:   Good  Insight:    Good  Judgment:   Good  Impulse Control:  Good   Risk Assessment: Danger to Self:  No Self-injurious Behavior: No Danger to Others: No Duty to Warn:no Physical Aggression / Violence:No  Access to Firearms a concern: No  Gang Involvement:No   Subjective: I met with the client face-to-face we both had facemasks. The client comes in today crying uncontrollably.  Her speech is very garbled and difficult to understand due to to the nature of her sobbing.  "I miss her.  I love her so much."  The client found out on Facebook that her ex-girlfriend is now in relationship with someone else.  She states that all of her friends at the hospital have now turned against the client.  She is unconsolable in her grief and sadness. I spoke to the client, stating that we needed to find a way through this for her.  I tried eye-movement around the cognition, "I cannot let her go."  The client feels trapped in her pain.  The eye-movement seem to make no difference for her.  It was only towards the end of the session that she cried less but ifd I mentioned her ex-girlfriend she started in again. The client has not been taking the Trintellix that has been prescribed for her.  I asked the client to take it as prescribed.  She is afraid to do so.  She has  plans to see Sherry Moat, PA-C on Wednesday.  She is also uncertain about seeing the PA.  I encouraged the client to follow through on her appointment and again asked her to take her medicine as prescribed. I also encouraged the client to go to work tonight.  She was not sure if she could do that.  I explained that it would be a good distraction for her.  She would be helping others and contributing to their good.  She denied any suicidality or homicidality.  She states she has not eaten much in the last few days.  She also reports poor to little sleep.  I asked the client to try to get something to eat and take a nap before she had to go to work this evening.  The client will consider this.  She is on my schedule to follow-up tomorrow July 8.  Interventions: Mindfulness Meditation, Solution-Oriented/Positive Psychology, [employer] Group Desensitization and Reprocessing (EMDR) and Insight-Oriented  Diagnosis:   ICD-10-CM   1. Adjustment disorder with depressed mood  F43.21   2. Generalized anxiety disorder  F41.1     Plan: Mindfulness, sleep, nutrition, contribute at work, radical acceptance.  This record has been created using [employer].  Chart creation errors have been sought, but Sherry Ewing not always have been located and corrected. Such creation errors do not reflect  on the standard of medical care.  Sherry Ewing, [facility]

## 2018-12-21 ENCOUNTER — Encounter: Payer: Self-pay | Admitting: Psychiatry

## 2018-12-21 ENCOUNTER — Other Ambulatory Visit: Payer: Self-pay

## 2018-12-21 ENCOUNTER — Ambulatory Visit: Payer: 59 | Admitting: Psychiatry

## 2018-12-21 DIAGNOSIS — F4321 Adjustment disorder with depressed mood: Secondary | ICD-10-CM | POA: Diagnosis not present

## 2018-12-21 DIAGNOSIS — F411 Generalized anxiety disorder: Secondary | ICD-10-CM | POA: Diagnosis not present

## 2018-12-21 NOTE — Progress Notes (Signed)
[facility] Counselor/Therapist Progress Note  Patient ID: Sherry Ewing, MRN: [id number],    Date: 12/21/2018  Time Spent: 50 minutes   Treatment Type: Individual Therapy  Reported Symptoms: sad, tearful, inconsolable.  Mental Status Exam:  Appearance:   Casual     Behavior:  Appropriate  Motor:  Normal  Speech/Language:   Clear and Coherent  Affect:  Tearful  Mood:  anxious, depressed, sad and dysphoric  Thought process:  normal  Thought content:    WNL  Sensory/Perceptual disturbances:    WNL  Orientation:  oriented to person, place, time/date and situation  Attention:  Good  Concentration:  Good  Memory:  WNL  Fund of knowledge:   Good  Insight:    Good  Judgment:   Good  Impulse Control:  Good   Risk Assessment: Danger to Self:  No Self-injurious Behavior: No Danger to Others: No Duty to Warn:no Physical Aggression / Violence:No  Access to Firearms a concern: No  Gang Involvement:No   Subjective: I met with the client face-to-face.  We both had facemasks. Today the client was still quite tearful but not so unconsolable.  Her speech was much more coherent and clear.  The client did work last night and things went well.  She stated she texted with an old friend that she had briefly dated.  "It feels so wrong."  I discussed with the client that she had the right to talk to other people to process her feelings.  She was no longer in relationship and would not be betraying her previous girlfriend.  I also suggested that she write a goodbye letter to her girlfriend that she not send.  The client states that she felt she did all of that with her text messages. The client did take one Trintellix.  She reports it made her sleepy. I asked her again to take the medicine but this time before she goes to bed.  She agreed to do so. I gave the client a handout on the power and control wheel.  The aspects of that that her girlfriend used against her was emotional abuse,  isolation, minimizing, denying and blaming.  I stated these were all dysfunctional aspects of the relationship.  Everything she takes responsibility for are things that in the normal relationship the other person would extend a lot of grace about.  Her girlfriend got angry because the client was not paying enough attention to her although she did not take into account the fact that the client's father had just died. I used the bilateral stimulation hand paddles with the client to the course of the session she cried much less although her affect was still quite sad.  She had poor eye contact but did respond with answers to my questions.  She will continue to walk and I encouraged her to connect with other friends.  Interventions: Mindfulness Meditation, Motivational Interviewing, Solution-Oriented/Positive Psychology, Psycho-education/Bibliotherapy, Eye Movement Desensitization and Reprocessing (EMDR) and Insight-Oriented  Diagnosis:   ICD-10-CM   1. Adjustment disorder with depressed mood  F43.21   2. Generalized anxiety disorder  F41.1     Plan: Review handout, exercise, positive self talk, mindfulness, radical acceptance, boundaries, assertiveness.  This record has been created using [employer].  Chart creation errors have been sought, but [name] not always have been located and corrected. Such creation errors do not reflect on the standard of medical care.  Marjorie Deprey, [facility]

## 2018-12-23 ENCOUNTER — Ambulatory Visit: Payer: 59 | Admitting: Psychiatry

## 2018-12-23 ENCOUNTER — Other Ambulatory Visit: Payer: Self-pay

## 2018-12-23 ENCOUNTER — Encounter: Payer: Self-pay | Admitting: Psychiatry

## 2018-12-23 DIAGNOSIS — F329 Major depressive disorder, single episode, unspecified: Secondary | ICD-10-CM | POA: Diagnosis not present

## 2018-12-23 DIAGNOSIS — F32A Depression, unspecified: Secondary | ICD-10-CM

## 2018-12-23 NOTE — Progress Notes (Signed)
      [facility] Counselor/Therapist Progress Note  Patient ID: Sherry Ewing, MRN: [id number],    Date: 12/23/2018  Time Spent: 50 minutes   Treatment Type: Individual Therapy  Reported Symptoms: sad, tearful, anxious, frustrated   Mental Status Exam:  Appearance:   Casual and Well Groomed     Behavior:  Appropriate  Motor:  Normal  Speech/Language:   Clear and Coherent  Affect:  Tearful  Mood:  anxious, depressed, irritable and sad  Thought process:  normal  Thought content:    WNL  Sensory/Perceptual disturbances:    WNL  Orientation:  oriented to person, place, time/date and situation  Attention:  Good  Concentration:  Good  Memory:  WNL  Fund of knowledge:   Good  Insight:    Good  Judgment:   Good  Impulse Control:  Good   Risk Assessment: Danger to Self:  No Self-injurious Behavior: No Danger to Others: No Duty to Warn:NO Physical Aggression / Violence:No  Access to Firearms a concern: No  Gang Involvement:No   Subjective: I met the client face to face.  We both had face masks.  The client states she did not see her girlfriend while she was at work this week.  This was a positive turn of events for her.  She is less tearful today but still has difficulty articulating what she is thinking.  The client had sent some text to another friend explaining the circumstances.  I pointed out to the client that in this scenario she described her girlfriend broke up with her within days after the client's father died.  I was puzzled as to why the girlfriend would get mad because the client did not answer the phone i.e. she was grief stricken and overwhelmed or sleeping.  It seems suspicious that the girlfriend can so easily end a 4-year relationship and enter another one unless she had been pulling away for period of time.  The client will consider some of this.  I encouraged the client to try to bring balance to her thought process.  It always takes 2 in the relationship for  problems to occur.  She is very much focused on what her parts were, now is the time to be a little bit more balanced and think about the relationship more globally. The client will continue to exercise.  She plans on meeting with a friend tonight for dinner.  I encouraged her to be proactive this weekend on her days off to do some things that bring refreshment and replenishment.  Interventions: Assertiveness/Communication, Motivational Interviewing, Solution-Oriented/Positive Psychology, [employer] Group Desensitization and Reprocessing (EMDR) and Insight-Oriented  Diagnosis:   ICD-10-CM   1. Depression, unspecified depression type  F32.9     Plan: Boundaries, self care, exercise, assertiveness, mindfulness.  This record has been created using [employer].  Chart creation errors have been sought, but Sherry Ewing not always have been located and corrected. Such creation errors do not reflect on the standard of medical care.  Sherry Ewing, [facility]

## 2018-12-28 ENCOUNTER — Encounter: Payer: Self-pay | Admitting: Physician Assistant

## 2018-12-28 ENCOUNTER — Other Ambulatory Visit: Payer: Self-pay

## 2018-12-28 ENCOUNTER — Ambulatory Visit (INDEPENDENT_AMBULATORY_CARE_PROVIDER_SITE_OTHER): Payer: 59 | Admitting: Physician Assistant

## 2018-12-28 ENCOUNTER — Ambulatory Visit (INDEPENDENT_AMBULATORY_CARE_PROVIDER_SITE_OTHER): Payer: 59 | Admitting: Psychiatry

## 2018-12-28 ENCOUNTER — Encounter: Payer: Self-pay | Admitting: Psychiatry

## 2018-12-28 DIAGNOSIS — F4310 Post-traumatic stress disorder, unspecified: Secondary | ICD-10-CM | POA: Diagnosis not present

## 2018-12-28 DIAGNOSIS — F909 Attention-deficit hyperactivity disorder, unspecified type: Secondary | ICD-10-CM

## 2018-12-28 DIAGNOSIS — F4321 Adjustment disorder with depressed mood: Secondary | ICD-10-CM

## 2018-12-28 DIAGNOSIS — G4709 Other insomnia: Secondary | ICD-10-CM

## 2018-12-28 DIAGNOSIS — F411 Generalized anxiety disorder: Secondary | ICD-10-CM

## 2018-12-28 MED ORDER — LISDEXAMFETAMINE DIMESYLATE 40 MG PO CAPS: 40.0000 mg | ORAL_CAPSULE | ORAL | 0 refills | Status: DC

## 2018-12-28 NOTE — Progress Notes (Signed)
      [facility] Counselor/Therapist Progress Note  Patient ID: Sherry Ewing, MRN: [id number],    Date: 12/28/2018  Time Spent: 50 minutes   Treatment Type: Individual Therapy  Reported Symptoms: anxious, tearful, sad, angry.  Mental Status Exam:  Appearance:   Casual     Behavior:  Appropriate  Motor:  Normal  Speech/Language:   Clear and Coherent  Affect:  Depressed  Mood:  angry, anxious, depressed and sad  Thought process:  normal  Thought content:    WNL  Sensory/Perceptual disturbances:    WNL  Orientation:  oriented to person, place, time/date and situation  Attention:  Good  Concentration:  Good  Memory:  WNL  Fund of knowledge:   Good  Insight:    Good  Judgment:   Good  Impulse Control:  Good   Risk Assessment: Danger to Self:  No Self-injurious Behavior: No Danger to Others: No Duty to Warn:no Physical Aggression / Violence:No  Access to Firearms a concern: No  Gang Involvement:No   Subjective: I met with the client face-to-face.  We both had facemasks. The client is still quite upset at the end of her 4-1/2-year relationship.  Today she was not crying uncontrollably as she had in the past.  She was not clear if she is taking her medication as prescribed or not.  She has had a hard time sleeping and getting things done around her house.  "It is all I can do to wash my clothes."  At her job she has not had any contact with her girlfriend who is a nurse in the ER. "I just do not understand how she could walk away from 4-1/2 years so suddenly?"  We discussed this at length.  It is uncertain unless she can talk to the girlfriend to determine what was really going on.  In the past when I have experienced those circumstances with previous clients usually there has been someone else in the picture that has been unknown.  The client believes the same thing but has not been able to talk to her ex-girlfriend.  We discussed if she could approach her and asked to have  some closure.  She feels like that would not go well and that she would be rejected.  I asked the client to continue to journal her thoughts and Miachel Nardelli be consider writing her a letter asking for clarification.  She would consider that.  Interventions: Assertiveness/Communication, Motivational Interviewing, Solution-Oriented/Positive Psychology, [employer] Group Desensitization and Reprocessing (EMDR) and Insight-Oriented  Diagnosis:   ICD-10-CM   1. Adjustment disorder with depressed mood  F43.21   2. Generalized anxiety disorder  F41.1     Plan: Positive self talk, boundaries, journaling.  This record has been created using [employer].  Chart creation errors have been sought, but Joeli Fenner not always have been located and corrected. Such creation errors do not reflect on the standard of medical care.  Floetta Brickey, [facility]

## 2018-12-28 NOTE — Progress Notes (Signed)
[facility] Med Check  Patient ID: Sherry Ewing,  MRN: [id number]  PCP: Harlan Stains, MD  Date of Evaluation: 12/28/2018 Time spent:15 minutes  Chief Complaint:  Chief Complaint    Depression      HISTORY/CURRENT STATUS: HPI Not doing well. Transferred to me after her provider and my colleague Comer Locket PA, passed away.   Dad died 2018-11-09 from cirrhosis.  Then she found out [place] had passed away and she had a good relationship w/ him.  A month after her dad died, her girlfriend left her.    Not sleeping well b/c she can't get her mind to quiet down. Had not been taking the Restoril but started it back in the past week or so, and it's helpful. Not enjoying anything, cries all the time, energy and motivation are low.  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  Doesn't want to be alone.  She and some work friends are walking several hours almost every day.  That helps her some. Denies SI/HI.  "If something happened to me, I wouldn't care, but I'm not going to do anything."  She did not start taking the Trintellix right away.  States she does not want to be on any antidepressant.  She wants to take as little as possible and for about a week now she has been on 5 mg.  She wants to stay on that dose.  Anxiety is still an issue but Xanax helps.    Rarely takes the Vyvanse.  Does need RX. Still helps focus when needed.  Only takes if working and can't concentrate.   Patient denies increased energy with decreased need for sleep, no increased talkativeness, no racing thoughts, no impulsivity or risky behaviors, no increased spending, no increased libido, no grandiosity.  Denies dizziness, syncope, seizures, numbness, tingling, tremor, tics, unsteady gait, slurred speech, confusion.  Denies muscle or joint pain, stiffness, or dystonia.  Individual Medical History/ Review of Systems: Changes? :No    Past medications for mental health diagnoses  include: Paxil, Buspar, Ambien, Trazodone  Allergies: Seroquel [quetiapine fumarate] and Tramadol hcl  Current Medications:  Current Outpatient Medications:  .  ALPRAZolam (XANAX) 0.5 MG tablet, TAKE 1 TO 2 TABLETS BY MOUTH EVERY DAY AS NEEDED FOR ANXIETY, Disp: 40 tablet, Rfl: 0 .  ibuprofen (ADVIL,MOTRIN) 800 MG tablet, Take 1 tablet (800 mg total) by mouth every 8 (eight) hours as needed for moderate pain., Disp: 21 tablet, Rfl: 0 .  lisdexamfetamine (VYVANSE) 40 MG capsule, Take 1 capsule (40 mg total) by mouth every morning., Disp: 30 capsule, Rfl: 0 .  pantoprazole (PROTONIX) 40 MG tablet, Take 40 mg by mouth daily., Disp: , Rfl:  .  temazepam (RESTORIL) 15 MG capsule, TAKE 1 TO 2 CAPSULES BY MOUTH EVERY DAY, Disp: 60 capsule, Rfl: 1 .  vortioxetine HBr (TRINTELLIX) 10 MG TABS tablet, Take one tablet daily (Patient taking differently: Take 5 mg by mouth. ), Disp: 30 tablet, Rfl: 2 .  HYDROcodone-acetaminophen (NORCO/VICODIN) 5-325 MG per tablet, Take 1-2 tablets by mouth every 6 (six) hours as needed for pain. (Patient not taking: Reported on 05/10/2018), Disp: 20 tablet, Rfl: 0 .  methocarbamol (ROBAXIN) 500 MG tablet, Take 1 tablet (500 mg total) by mouth every 8 (eight) hours as needed for muscle spasms. (Patient not taking: Reported on 05/10/2018), Disp: 20 tablet, Rfl: 0 .  norgestrel-ethinyl estradiol (LO/OVRAL) 0.3-30 MG-MCG per tablet, Take 1 tablet by mouth daily.  , Disp: ,  Rfl:  Medication Side Effects: none  Family Medical/ Social History: Changes? Yes Father died May 6th.   MENTAL HEALTH EXAM:  There were no vitals taken for this visit.There is no height or weight on file to calculate BMI.  General Appearance: Casual and Obese  Eye Contact:  Good  Speech:  Clear and Coherent  Volume:  Normal  Mood:  Depressed  Affect:  Depressed and Tearful  Thought Process:  Goal Directed  Orientation:  Full (Time, Place, and Person)  Thought Content: Logical   Suicidal Thoughts:   No  Homicidal Thoughts:  No  Memory:  WNL  Judgement:  Good  Insight:  Good  Psychomotor Activity:  Normal fidgety  Concentration:  Concentration: Good and Attention Span: Good when she takes the Vyvanse  Recall:  Good  Fund of Knowledge: Good  Language: Good  Assets:  Desire for Improvement  ADL's:  Intact  Cognition: WNL  Prognosis:  Good    DIAGNOSES:    ICD-10-CM   1. Adjustment disorder with depressed mood  F43.21   2. Generalized anxiety disorder  F41.1   3. Attention deficit hyperactivity disorder (ADHD), unspecified ADHD type  F90.9   4. PTSD (post-traumatic stress disorder)  F43.10     Receiving Psychotherapy: Yes  with Sherron MondayFred May, LPC   RECOMMENDATIONS:  Continue Trintellix 5 mg daily for now.  I encouraged her to increase dose to 10 mg as it is a more therapeutic dose.  However she is hesitant and I respect that.  We will leave it at 5 mg for now. Continue Restoril 15 mg 1/2-1 nightly as needed. Continue Vyvanse 40 mg every morning PRN.  PDMP was reviewed. Continue Xanax 0.5 mg 1-2 daily as needed. Continue psychotherapy with Sherron MondayFred May, LPC. Return in 4 weeks.   Melony Overlyeresa Adelma Bowdoin, PA-C   This record has been created using [employer] software.  Chart creation errors have been sought, but may not always have been located and corrected. Such creation errors do not reflect on the standard of medical care.

## 2019-01-02 ENCOUNTER — Encounter: Payer: Self-pay | Admitting: Psychiatry

## 2019-01-02 ENCOUNTER — Ambulatory Visit (INDEPENDENT_AMBULATORY_CARE_PROVIDER_SITE_OTHER): Payer: 59 | Admitting: Psychiatry

## 2019-01-02 ENCOUNTER — Telehealth: Payer: Self-pay | Admitting: Physician Assistant

## 2019-01-02 ENCOUNTER — Other Ambulatory Visit: Payer: Self-pay

## 2019-01-02 DIAGNOSIS — F320 Major depressive disorder, single episode, mild: Secondary | ICD-10-CM

## 2019-01-02 MED ORDER — ALPRAZOLAM 0.5 MG PO TABS: ORAL_TABLET | ORAL | 0 refills | Status: DC

## 2019-01-02 NOTE — Progress Notes (Signed)
      [facility] Counselor/Therapist Progress Note  Patient ID: Sherry Ewing, MRN: [id number],    Date: 01/02/2019  Time Spent: 50 minutes   Treatment Type: Individual Therapy  Reported Symptoms: sad, anxious, lacknof motivation, poor sleep  Mental Status Exam:  Appearance:   Casual     Behavior:  Appropriate  Motor:  Normal  Speech/Language:   Clear and Coherent  Affect:  Depressed  Mood:  anxious, depressed and sad  Thought process:  normal  Thought content:    WNL  Sensory/Perceptual disturbances:    WNL  Orientation:  oriented to person, place, time/date and situation  Attention:  Good  Concentration:  Good  Memory:  WNL  Fund of knowledge:   Good  Insight:    Good  Judgment:   Good  Impulse Control:  Good   Risk Assessment: Danger to Self:  No Self-injurious Behavior: No Danger to Others: No Duty to Warn:no Physical Aggression / Violence:No  Access to Firearms a concern: No  Gang Involvement:No   Subjective: The client reports that she has been taking the Trintellix 10 mg for about 3 days.  She states that she is crying less but there are times when she wants to cry when she cannot.  She stated she was talking to her mom who told her, "to quit wallowing in self-pity."  This really hurt the client.  "She is all I have." I discussed with the client that she was not wallowing in self-pity but going through a grief process at the death of her relationship.  On top of that she is also dealing with the grief and death of her father who died less than a month ago. We discussed to determine a way forward for the client.  She states she recently called her sister-in-law and asked her to pray for her.  The client is trying to focus on developing more of a spiritual life.  I asked her to read through psalms as Shanon Brow had a lot of grief, anxiety and sadness. The client states she is not taking care of things at home.  She has been having a lack of motivation.  She denies any  suicidal ideation.  The client also states that she misses her girlfriend's kids and personally regrets not having her own children.  I asked the client to focus on what she can do in the present tense to help herself.  She will think about what that looks like for her.  Interventions: Assertiveness/Communication, Motivational Interviewing, Solution-Oriented/Positive Psychology, [employer] Group Desensitization and Reprocessing (EMDR) and Insight-Oriented  Diagnosis:   ICD-10-CM   1. Depression, major, single episode, mild ([place])  F32.0     Plan: Sleep hygiene, mood independent behavior, boundaries.  This record has been created using [employer].  Chart creation errors have been sought, but Sherry Ewing not always have been located and corrected. Such creation errors do not reflect on the standard of medical care.Sherry Ewing, [facility]

## 2019-01-02 NOTE — Telephone Encounter (Signed)
Refill called in per request

## 2019-01-02 NOTE — Telephone Encounter (Signed)
Pt is requesting Xanax refill sent to [facility] on [address].

## 2019-01-06 ENCOUNTER — Ambulatory Visit (INDEPENDENT_AMBULATORY_CARE_PROVIDER_SITE_OTHER): Payer: 59 | Admitting: Psychiatry

## 2019-01-06 ENCOUNTER — Encounter: Payer: Self-pay | Admitting: Psychiatry

## 2019-01-06 ENCOUNTER — Other Ambulatory Visit: Payer: Self-pay

## 2019-01-06 DIAGNOSIS — F320 Major depressive disorder, single episode, mild: Secondary | ICD-10-CM | POA: Diagnosis not present

## 2019-01-06 NOTE — Progress Notes (Signed)
      [facility] Counselor/Therapist Progress Note  Patient ID: Sherry Ewing, MRN: [id number],    Date: 01/06/2019  Time Spent: 50 minutes   Treatment Type: Individual Therapy  Reported Symptoms: sad, irritable, depressed.  Mental Status Exam:  Appearance:   Casual     Behavior:  Appropriate  Motor:  Normal  Speech/Language:   Clear and Coherent  Affect:  Appropriate  Mood:  depressed, irritable and sad  Thought process:  normal  Thought content:    WNL  Sensory/Perceptual disturbances:    WNL  Orientation:  oriented to person, place, time/date and situation  Attention:  Good  Concentration:  Good  Memory:  WNL  Fund of knowledge:   Good  Insight:    Good  Judgment:   Good  Impulse Control:  Good   Risk Assessment: Danger to Self:  No Self-injurious Behavior: No Danger to Others: No Duty to Warn:no Physical Aggression / Violence:No  Access to Firearms a concern: No  Gang Involvement:No   Subjective: The client states she has been less emotional on Trintellix.  "I need to cry."  She states work is been more difficult.  She has not seen her ex-girlfriend at the hospital as she has expected.  "I will probably see her this next week."  This causes quite a bit of disturbance for the client.  She had asked her captain if she could be transferred to the [employer] station.  He texted her back, "you need to figure it out or find another job." I used the bilateral stimulation hand paddles with the client as she talked emoted about her ex-girlfriend.  The client is angry with her but still misses her.  She states she needs to get her stuff from her ex-girlfriend's house.  She is afraid to go by in case she is there.  There does not seem to be any chance of closure.  She states she still talks with her girlfriends sister each day who has firmly planted herself on the client's side.  This is caused a rift within the ex-girlfriend's family.  The client stated, "I am tired of feeling  terrible".  We discussed the use of radical acceptance.  That things will not change with her ex-girlfriend that she will need to move on even though she does not want to. We discussed what the client needed to do to feel less terrible.  I asked the client to consider exercise again such as walking and practicing more mood independent behavior.  She also needs to increase her social network.  Interventions: Assertiveness/Communication, Motivational Interviewing, Solution-Oriented/Positive Psychology, CIT Group Desensitization and Reprocessing (EMDR) and Insight-Oriented  Diagnosis:No diagnosis found.  Plan: Boundaries, self care, exercise, social network, mood independent behavior, Radical acceptance.  This record has been created using [employer].  Chart creation errors have been sought, but Hanne Kegg not always have been located and corrected. Such creation errors do not reflect on the standard of medical care.  Khamron Gellert, [facility]

## 2019-01-11 ENCOUNTER — Ambulatory Visit (INDEPENDENT_AMBULATORY_CARE_PROVIDER_SITE_OTHER): Payer: 59 | Admitting: Psychiatry

## 2019-01-11 ENCOUNTER — Encounter: Payer: Self-pay | Admitting: Psychiatry

## 2019-01-11 ENCOUNTER — Other Ambulatory Visit: Payer: Self-pay

## 2019-01-11 DIAGNOSIS — F320 Major depressive disorder, single episode, mild: Secondary | ICD-10-CM

## 2019-01-11 NOTE — Progress Notes (Signed)
[facility] Counselor/Therapist Progress Note  Patient ID: Sherry Ewing, MRN: [id number],    Date: 01/11/2019  Time Spent: 50 minutes   Treatment Type: Individual Therapy  Reported Symptoms: depressed, anxious, tearful.  Mental Status Exam:  Appearance:   Casual     Behavior:  Agitated  Motor:  Normal  Speech/Language:   Garbled and Slow  Affect:  Depressed and Tearful  Mood:  anxious, depressed and sad  Thought process:  normal  Thought content:    WNL  Sensory/Perceptual disturbances:    WNL  Orientation:  oriented to person, place, time/date and situation  Attention:  Good  Concentration:  Good  Memory:  WNL  Fund of knowledge:   Good  Insight:    Good  Judgment:   Good  Impulse Control:  Good   Risk Assessment: Danger to Self:  No Self-injurious Behavior: No Danger to Others: No Duty to Warn:no Physical Aggression / Violence:No  Access to Firearms a concern: No  Gang Involvement:No   Subjective: I met with the client face-to-face.  We both had facemasks. The client states that she had a dream where she and her girlfriend had an adoptive daughter.  They were on a trip with the client's deceased father and brother.  She had a secondary with the adoptive daughter and Rob Bunting.  As the client related this she was very sad and tearful.  It was difficult to understand her through her tears. She did say that her ex-girlfriend had posted a new picture of her current girlfriend.  This brought on another paroxysms of tears.  I asked the client "How could I be helpful for her today? She did not know. I tried to use eye-movement with the client around her negative thought which was, "I do not want to do anything.  I miss the kids."  She feels deep pain in her chest.  Her subjective units of distress is a 9+.  As I processed with the client she asked the question, "how could she do this?  I miss my dad so much."  I continue with the eye-movement and the client continue  to emote deeply.  She eventually did calm down and her subjective units of distress was at a 4+. The client stated that her blood pressure had dropped significantly last night.  She admitted that she was not eating or drinking.  We discussed that not eating or drinking could cause her severe repercussions.  I brought her a bottle of water and suggested that she go to the local "[employer] "and get a protein shake to sip on.  She agreed to do so.  I also suggested to the client to contact some friends and plan some activities outside of her house.  Her complaint is that she does not want to "use" people.  I pointed out to the client that is not her responsibility to make decisions for others but let them make that decision.  If they want to spend time with her they will let her know.  The client agreed.  Interventions: Assertiveness/Communication, Motivational Interviewing, Solution-Oriented/Positive Psychology, CIT Group Desensitization and Reprocessing (EMDR) and Insight-Oriented  Diagnosis:No diagnosis found.  Plan: Eat and drink regularly, activities with friends, positive self talk, self-care, boundaries.  This record has been created using [employer].  Chart creation errors have been sought, but Nyshawn Gowdy not always have been located and corrected. Such creation errors do not reflect on the standard of medical care.  Lacrisha Bielicki,  LCMHCS

## 2019-01-16 ENCOUNTER — Ambulatory Visit (INDEPENDENT_AMBULATORY_CARE_PROVIDER_SITE_OTHER): Payer: 59 | Admitting: Psychiatry

## 2019-01-16 ENCOUNTER — Encounter: Payer: Self-pay | Admitting: Psychiatry

## 2019-01-16 ENCOUNTER — Other Ambulatory Visit: Payer: Self-pay

## 2019-01-16 DIAGNOSIS — F320 Major depressive disorder, single episode, mild: Secondary | ICD-10-CM | POA: Diagnosis not present

## 2019-01-16 NOTE — Progress Notes (Signed)
      [facility] Counselor/Therapist Progress Note  Patient ID: Sherry Ewing, MRN: [id number],    Date: 01/16/2019  Time Spent: 45 minutes   Treatment Type: Individual Therapy  Reported Symptoms: sad, angry, anxious, loss of motivation. Mental Status Exam:  Appearance:   Casual     Behavior:  Appropriate  Motor:  Normal  Speech/Language:   Garbled  Affect:  Depressed and Tearful  Mood:  angry, anxious, depressed and sad  Thought process:  normal  Thought content:    WNL  Sensory/Perceptual disturbances:    WNL  Orientation:  oriented to person, place, time/date and situation  Attention:  Good  Concentration:  Good  Memory:  WNL  Fund of knowledge:   Good  Insight:    Good  Judgment:   Good  Impulse Control:  Good   Risk Assessment: Danger to Self:  No Self-injurious Behavior: No Danger to Others: No Duty to Warn:no Physical Aggression / Violence:No  Access to Firearms a concern: No  Gang Involvement:No   Subjective: The client reports that she is more angry today.  She is also quite tearful.  She plans to go to her girlfriend's house tonight when she is at work to go ahead and get her stuff.  The client will leave her key to the ex-girlfriend's house.  The client has not run into her ex-girlfriend at work.  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  She has not been at [facility] since her break-up. "I am angry she stuck me with a $6000 credit card bill after I floated the bill for [place] with her and her sister and the kids.  I will never get that back."  I asked the client what she could do and she had no good answers.  I suggested that she journal her thoughts and feelings that she might give to her ex-girlfriend to help her get closure.  The client feels that that would not be helpful. "I miss my dad."  At this the client became very tearful and hard to understand.  Even though her dad died of severe  alcoholism he still was a support for her during difficult times.  The response from her mother has been more negative telling her to "quit wallowing in self-pity."  I discussed with the client that she is trying to process her grief not only with her father but with the broken relationship as well. The client has not walked for a few weeks.  I encouraged her to pick that back up.  She is continued with the Trintellix.  She has had some social interaction and denies any suicidal ideation.  I went back over mood independent behavior.  Interventions: Assertiveness/Communication, Motivational Interviewing, Solution-Oriented/Positive Psychology, CIT Group Desensitization and Reprocessing (EMDR) and Insight-Oriented  Diagnosis:   ICD-10-CM   1. Depression, major, single episode, mild ([facility])  F32.0     Plan: Walk, mood independent behavior, self-care, journaling.  This record has been created using [employer].  Chart creation errors have been sought, but Evania Lyne not always have been located and corrected. Such creation errors do not reflect on the standard of medical care.  Madilynne Mullan, [facility]

## 2019-01-19 ENCOUNTER — Ambulatory Visit: Payer: 59 | Admitting: Psychiatry

## 2019-01-24 ENCOUNTER — Encounter: Payer: Self-pay | Admitting: Psychiatry

## 2019-01-24 ENCOUNTER — Ambulatory Visit (INDEPENDENT_AMBULATORY_CARE_PROVIDER_SITE_OTHER): Payer: 59 | Admitting: Psychiatry

## 2019-01-24 ENCOUNTER — Other Ambulatory Visit: Payer: Self-pay

## 2019-01-24 DIAGNOSIS — F320 Major depressive disorder, single episode, mild: Secondary | ICD-10-CM | POA: Diagnosis not present

## 2019-01-24 NOTE — Progress Notes (Signed)
      [facility] Counselor/Therapist Progress Note  Patient ID: Sherry Ewing, MRN: [id number],    Date: 01/24/2019  Time Spent: 50 minutes   Treatment Type: Individual Therapy  Reported Symptoms: anxiety, sadness, dysphoria, depressed mood  Mental Status Exam:  Appearance:   Casual     Behavior:  Agitated  Motor:  Normal  Speech/Language:   Garbled  Affect:  Depressed and Tearful  Mood:  anxious, depressed and sad  Thought process:  normal  Thought content:    Rumination  Sensory/Perceptual disturbances:    WNL  Orientation:  oriented to person, place, time/date and situation  Attention:  Good  Concentration:  Good  Memory:  WNL  Fund of knowledge:   Good  Insight:    Good  Judgment:   Good  Impulse Control:  Good   Risk Assessment: Danger to Self:  No Self-injurious Behavior: No Danger to Others: No Duty to Warn:no Physical Aggression / Violence:No  Access to Firearms a concern: No  Gang Involvement:No   Subjective: The client reports that she went to her ex -girlfriends house and got her stuff.  She also ended up taking some things that she had given his gifts.  The next morning the ex-girlfriend was knocking on her front door.  The client called dispatch and the police arrived.  She did not talk to her ex-girlfriend directly but gave her key to the police who returned it to her.  The ex-girlfriend said she was going to file a restraining order against her and charged her for breaking into her house. The client is very upset because this could cause her to lose her job.  In addition, the client found information that her girlfriend was actually talking to the woman she is planning on marrying in October.  This upset the client greatly as well and made her very angry.  Today the client was so upset it was hard to understand her in between her sobs. We discussed consulting with one of her friends who is a Engineer, structural about the most efficient way to approach all of  this.  I encouraged the client not to act until something happens.  The client was also so disturbed tonight that I wrote her a note for work about starting [place].  Between this break-up and her dad's death the client has been in a major depressive episode.  She does continue to take her Trintellix.  I encouraged the client to journal and maybe stay with some friends temporarily.  I used the bilateral stimulation hand paddles to help calm the client down from the subjective units of distress of 10 to 6.  Interventions: Motivational Interviewing, Solution-Oriented/Positive Psychology, CIT Group Desensitization and Reprocessing (EMDR) and Insight-Oriented  Diagnosis:   ICD-10-CM   1. Depression, major, single episode, mild ([place])  F32.0     Plan: Journal, start FMLA, self-care, sleep hygiene, boundaries, assertiveness.  This record has been created using [employer].  Chart creation errors have been sought, but Kemani Heidel not always have been located and corrected. Such creation errors do not reflect on the standard of medical care.'  Clarita Leber, [facility]

## 2019-01-25 ENCOUNTER — Ambulatory Visit: Payer: 59 | Admitting: Psychiatry

## 2019-01-26 ENCOUNTER — Encounter

## 2019-01-30 ENCOUNTER — Ambulatory Visit: Payer: 59 | Admitting: Psychiatry

## 2019-01-30 ENCOUNTER — Other Ambulatory Visit: Payer: Self-pay | Admitting: Physician Assistant

## 2019-01-31 ENCOUNTER — Other Ambulatory Visit: Payer: Self-pay

## 2019-01-31 ENCOUNTER — Encounter: Payer: Self-pay | Admitting: Physician Assistant

## 2019-01-31 ENCOUNTER — Ambulatory Visit (INDEPENDENT_AMBULATORY_CARE_PROVIDER_SITE_OTHER): Payer: 59 | Admitting: Physician Assistant

## 2019-01-31 DIAGNOSIS — F4310 Post-traumatic stress disorder, unspecified: Secondary | ICD-10-CM

## 2019-01-31 DIAGNOSIS — G4709 Other insomnia: Secondary | ICD-10-CM | POA: Diagnosis not present

## 2019-01-31 DIAGNOSIS — F411 Generalized anxiety disorder: Secondary | ICD-10-CM | POA: Diagnosis not present

## 2019-01-31 DIAGNOSIS — F329 Major depressive disorder, single episode, unspecified: Secondary | ICD-10-CM | POA: Diagnosis not present
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
## 2019-01-31 NOTE — Telephone Encounter (Signed)
Has appt this morning at 930am

## 2019-02-02 ENCOUNTER — Ambulatory Visit: Payer: 59 | Admitting: Psychiatry

## 2019-02-04 ENCOUNTER — Other Ambulatory Visit: Payer: Self-pay | Admitting: Physician Assistant

## 2019-02-13 ENCOUNTER — Ambulatory Visit (INDEPENDENT_AMBULATORY_CARE_PROVIDER_SITE_OTHER): Payer: 59 | Admitting: Psychiatry

## 2019-02-13 ENCOUNTER — Encounter: Payer: Self-pay | Admitting: Psychiatry

## 2019-02-13 ENCOUNTER — Other Ambulatory Visit: Payer: Self-pay

## 2019-02-13 DIAGNOSIS — F329 Major depressive disorder, single episode, unspecified: Secondary | ICD-10-CM | POA: Diagnosis not present
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
## 2019-02-27 ENCOUNTER — Encounter: Payer: Self-pay | Admitting: Psychiatry

## 2019-02-27 ENCOUNTER — Other Ambulatory Visit: Payer: Self-pay

## 2019-02-27 ENCOUNTER — Ambulatory Visit (INDEPENDENT_AMBULATORY_CARE_PROVIDER_SITE_OTHER): Payer: 59 | Admitting: Psychiatry

## 2019-02-27 DIAGNOSIS — F411 Generalized anxiety disorder: Secondary | ICD-10-CM
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
## 2019-03-03 ENCOUNTER — Other Ambulatory Visit: Payer: Self-pay | Admitting: Physician Assistant

## 2019-03-07 ENCOUNTER — Other Ambulatory Visit: Payer: Self-pay | Admitting: Psychiatry

## 2019-03-13 ENCOUNTER — Ambulatory Visit: Payer: 59 | Admitting: Psychiatry

## 2019-03-23 ENCOUNTER — Other Ambulatory Visit: Payer: Self-pay

## 2019-03-23 DIAGNOSIS — Z20822 Contact with and (suspected) exposure to covid-19: Secondary | ICD-10-CM

## 2019-03-24 LAB — NOVEL CORONAVIRUS, NAA: SARS-CoV-2, NAA: NOT DETECTED

## 2019-03-25 ENCOUNTER — Other Ambulatory Visit: Payer: Self-pay | Admitting: Physician Assistant

## 2019-03-26 NOTE — Telephone Encounter (Signed)
appt 10/16

## 2019-03-27 ENCOUNTER — Other Ambulatory Visit: Payer: Self-pay

## 2019-03-27 ENCOUNTER — Ambulatory Visit (INDEPENDENT_AMBULATORY_CARE_PROVIDER_SITE_OTHER): Payer: 59 | Admitting: Psychiatry

## 2019-03-27 ENCOUNTER — Encounter: Payer: Self-pay | Admitting: Psychiatry

## 2019-03-27 DIAGNOSIS — F329 Major depressive disorder, single episode, unspecified: Secondary | ICD-10-CM | POA: Diagnosis not present
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
## 2019-03-29 ENCOUNTER — Other Ambulatory Visit: Payer: Self-pay | Admitting: Family Medicine

## 2019-03-29 DIAGNOSIS — R519 Headache, unspecified: Secondary | ICD-10-CM

## 2019-03-31 ENCOUNTER — Ambulatory Visit: Payer: 59 | Admitting: Physician Assistant

## 2019-04-04 ENCOUNTER — Other Ambulatory Visit: Payer: Self-pay | Admitting: Physician Assistant

## 2019-04-04 NOTE — Telephone Encounter (Signed)
appt 11/05 

## 2019-04-10 ENCOUNTER — Ambulatory Visit: Payer: 59 | Admitting: Psychiatry

## 2019-04-20 ENCOUNTER — Other Ambulatory Visit: Payer: Self-pay

## 2019-04-20 ENCOUNTER — Encounter: Payer: Self-pay | Admitting: Physician Assistant

## 2019-04-20 ENCOUNTER — Ambulatory Visit (INDEPENDENT_AMBULATORY_CARE_PROVIDER_SITE_OTHER): Payer: 59 | Admitting: Physician Assistant

## 2019-04-20 DIAGNOSIS — F4310 Post-traumatic stress disorder, unspecified: Secondary | ICD-10-CM

## 2019-04-20 DIAGNOSIS — F411 Generalized anxiety disorder: Secondary | ICD-10-CM

## 2019-04-20 DIAGNOSIS — F329 Major depressive disorder, single episode, unspecified: Secondary | ICD-10-CM

## 2019-04-20 DIAGNOSIS — F909 Attention-deficit hyperactivity disorder, unspecified type: Secondary | ICD-10-CM | POA: Diagnosis not present

## 2019-04-20 MED ORDER — VORTIOXETINE HBR 5 MG PO TABS: 7.5000 mg | ORAL_TABLET | Freq: Every day | ORAL | 1 refills | Status: DC

## 2019-04-20 MED ORDER — LISDEXAMFETAMINE DIMESYLATE 40 MG PO CAPS: 40.0000 mg | ORAL_CAPSULE | ORAL | 0 refills | Status: DC

## 2019-04-20 NOTE — Progress Notes (Signed)
[facility] Med Check  Patient ID: Sherry Ewing,  MRN: [id number]  PCP: Sherry Stains, MD  Date of Evaluation: 04/20/2019 Time spent:15 minutes  Chief Complaint:  Chief Complaint    Follow-up      HISTORY/CURRENT STATUS: HPI For routine med check.  She's been on Trintellix 10 mg for about 3-4 months now. "It helps a lot, way better than the 5 mg did.  But I feel 'blah.' I don't get down.  I'm not grieving my dad.  We're having his memorial service the weekend of Thanksgiving and nobody is coming.  I should be sad about it.  But I'm not.  I don't have any real emotion at all."  Likes feeling better but doesn't like not feeling anything at all.    She is able to enjoy things.  Energy and motivation are good.  She sleeps pretty well.  Work is going fine.  She found out that her ex girlfriend got married a few weeks ago.  "I still get angry about that but I am glad she is out of my life."  Patient is still taking Vyvanse only on the days that she works.  Anxiety is controlled with the Xanax.  Denies dizziness, syncope, seizures, numbness, tingling, tremor, tics, unsteady gait, slurred speech, confusion. Denies muscle or joint pain, stiffness, or dystonia.  Individual Medical History/ Review of Systems: Changes? :No    Past medications for mental health diagnoses include: Paxil, Buspar, Ambien, Trazodone  Allergies: Seroquel [quetiapine fumarate] and Tramadol hcl  Current Medications:  Current Outpatient Medications:  .  ALPRAZolam (XANAX) 0.5 MG tablet, TAKE 1 TO 2 TABLETS BY MOUTH EVERY DAY AS NEEDED FOR ANXIETY, Disp: 40 tablet, Rfl: 0 .  BREO ELLIPTA 100-25 MCG/INH AEPB, , Disp: , Rfl:  .  eletriptan (RELPAX) 40 MG tablet, , Disp: , Rfl:  .  ibuprofen (ADVIL,MOTRIN) 800 MG tablet, Take 1 tablet (800 mg total) by mouth every 8 (eight) hours as needed for moderate pain., Disp: 21 tablet, Rfl: 0 .  lisdexamfetamine (VYVANSE) 40 MG capsule, Take 1 capsule (40 mg total) by mouth  every morning., Disp: 30 capsule, Rfl: 0 .  pantoprazole (PROTONIX) 40 MG tablet, Take 40 mg by mouth daily., Disp: , Rfl:  .  temazepam (RESTORIL) 15 MG capsule, TAKE 1 TO 2 CAPSULES BY MOUTH AT BEDTIME, Disp: 60 capsule, Rfl: 1 .  HYDROcodone-acetaminophen (NORCO/VICODIN) 5-325 MG per tablet, Take 1-2 tablets by mouth every 6 (six) hours as needed for pain. (Patient not taking: Reported on 05/10/2018), Disp: 20 tablet, Rfl: 0 .  methocarbamol (ROBAXIN) 500 MG tablet, Take 1 tablet (500 mg total) by mouth every 8 (eight) hours as needed for muscle spasms. (Patient not taking: Reported on 05/10/2018), Disp: 20 tablet, Rfl: 0 .  norgestrel-ethinyl estradiol (LO/OVRAL) 0.3-30 MG-MCG per tablet, Take 1 tablet by mouth daily.  , Disp: , Rfl:  .  vortioxetine HBr (TRINTELLIX) 5 MG TABS tablet, Take 1.5 tablets (7.5 mg total) by mouth daily., Disp: 45 tablet, Rfl: 1 Medication Side Effects: none  Family Medical/ Social History: Changes? No  MENTAL HEALTH EXAM:  There were no vitals taken for this visit.There is no height or weight on file to calculate BMI.  General Appearance: Casual, Neat, Well Groomed and Obese  Eye Contact:  Good  Speech:  Clear and Coherent  Volume:  Normal  Mood:  Euthymic  Affect:  Appropriate  Thought Process:  Goal Directed and Descriptions of Associations: Intact  Orientation:  Full (Time, Place,  and Person)  Thought Content: Logical   Suicidal Thoughts:  No  Homicidal Thoughts:  No  Memory:  WNL  Judgement:  Good  Insight:  Good  Psychomotor Activity:  Normal  Concentration:  Concentration: Good and Attention Span: Good  Recall:  Good  Fund of Knowledge: Good  Language: Good  Assets:  Desire for Improvement  ADL's:  Intact  Cognition: WNL  Prognosis:  Good    DIAGNOSES:    ICD-10-CM   1. PTSD (post-traumatic stress disorder)  F43.10   2. Reactive depression  F32.9   3. Generalized anxiety disorder  F41.1   4. Attention deficit hyperactivity disorder  (ADHD), unspecified ADHD type  F90.9     Receiving Psychotherapy: Yes  Sherry Ewing, [facility]    RECOMMENDATIONS:  We discussed decreasing the Trintellix to 7.5 mg.  The pills are in weird shape so it Ewing be difficult to get it exactly to 7.5 but we both would like for her to try that.  She will start that after her father's memorial service.  She can go back up to 10 mg if she starts becoming more emotional and crying all the time like she did before increasing the dose of the Trintellix. Decrease Trintellix to 5 mg, 1-1/2 pills daily.  (Begin after Thanksgiving) Continue Xanax 0.5 mg 1 twice daily as needed. Continue Vyvanse 40 mg p.o. daily as needed.  She only takes on the days that she works. Continue Restoril 15 mg 1 nightly as needed sleep. Continue therapy with Sherry Ewing, [facility]. Return in 2 months.  Sherry Overly, PA-C

## 2019-04-24 ENCOUNTER — Ambulatory Visit
Admission: RE | Admit: 2019-04-24 | Discharge: 2019-04-24 | Disposition: A | Discharge status: Home / Self Care | Payer: 59 | Source: Ambulatory Visit | Attending: Family Medicine | Admitting: Family Medicine

## 2019-04-24 ENCOUNTER — Ambulatory Visit (INDEPENDENT_AMBULATORY_CARE_PROVIDER_SITE_OTHER): Payer: 59 | Admitting: Psychiatry

## 2019-04-24 ENCOUNTER — Other Ambulatory Visit: Payer: Self-pay

## 2019-04-24 ENCOUNTER — Encounter: Payer: Self-pay | Admitting: Psychiatry

## 2019-04-24 DIAGNOSIS — R519 Headache, unspecified: Secondary | ICD-10-CM

## 2019-04-24 DIAGNOSIS — F411 Generalized anxiety disorder: Secondary | ICD-10-CM | POA: Diagnosis not present
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
## 2019-04-28 ENCOUNTER — Other Ambulatory Visit: Payer: Self-pay | Admitting: Physician Assistant

## 2019-04-29 ENCOUNTER — Other Ambulatory Visit: Payer: Self-pay | Admitting: Physician Assistant

## 2019-04-30 NOTE — Telephone Encounter (Signed)
Next apt 06/29/2019

## 2019-05-08 ENCOUNTER — Ambulatory Visit: Payer: 59 | Admitting: Psychiatry

## 2019-05-09 ENCOUNTER — Other Ambulatory Visit: Payer: Self-pay

## 2019-05-09 DIAGNOSIS — Z20822 Contact with and (suspected) exposure to covid-19: Secondary | ICD-10-CM

## 2019-05-10 LAB — NOVEL CORONAVIRUS, NAA: SARS-CoV-2, NAA: NOT DETECTED

## 2019-05-18 ENCOUNTER — Ambulatory Visit: Payer: 59 | Admitting: Psychiatry

## 2019-05-19 ENCOUNTER — Emergency Department (HOSPITAL_BASED_OUTPATIENT_CLINIC_OR_DEPARTMENT_OTHER): Payer: No Typology Code available for payment source

## 2019-05-19 ENCOUNTER — Other Ambulatory Visit: Payer: Self-pay

## 2019-05-19 ENCOUNTER — Emergency Department (HOSPITAL_BASED_OUTPATIENT_CLINIC_OR_DEPARTMENT_OTHER)
Admission: EM | Admit: 2019-05-19 | Discharge: 2019-05-19 | Disposition: A | Discharge status: Home / Self Care | Payer: No Typology Code available for payment source | Attending: Emergency Medicine | Admitting: Emergency Medicine

## 2019-05-19 ENCOUNTER — Encounter (HOSPITAL_BASED_OUTPATIENT_CLINIC_OR_DEPARTMENT_OTHER): Payer: Self-pay | Admitting: *Deleted

## 2019-05-19 DIAGNOSIS — M79644 Pain in right finger(s): Secondary | ICD-10-CM | POA: Diagnosis present

## 2019-05-19 DIAGNOSIS — Z888 Allergy status to other drugs, medicaments and biological substances status: Secondary | ICD-10-CM | POA: Diagnosis not present

## 2019-05-19 DIAGNOSIS — Z79899 Other long term (current) drug therapy: Secondary | ICD-10-CM | POA: Diagnosis not present

## 2019-05-19 DIAGNOSIS — Z885 Allergy status to narcotic agent status: Secondary | ICD-10-CM | POA: Diagnosis not present

## 2019-05-19 DIAGNOSIS — S6991XA Unspecified injury of right wrist, hand and finger(s), initial encounter: Secondary | ICD-10-CM
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
## 2019-05-22 ENCOUNTER — Other Ambulatory Visit: Payer: Self-pay

## 2019-05-22 ENCOUNTER — Ambulatory Visit (INDEPENDENT_AMBULATORY_CARE_PROVIDER_SITE_OTHER): Payer: 59 | Admitting: Psychiatry

## 2019-05-22 ENCOUNTER — Encounter: Payer: Self-pay | Admitting: Psychiatry

## 2019-05-22 ENCOUNTER — Ambulatory Visit: Payer: 59 | Admitting: Psychiatry

## 2019-05-22 DIAGNOSIS — F329 Major depressive disorder, single episode, unspecified: Secondary | ICD-10-CM
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
## 2019-05-23 ENCOUNTER — Other Ambulatory Visit: Payer: Self-pay | Admitting: Physician Assistant

## 2019-06-03 ENCOUNTER — Other Ambulatory Visit: Payer: Self-pay | Admitting: Physician Assistant

## 2019-06-04 NOTE — Telephone Encounter (Signed)
Apt 01/14

## 2019-06-05 ENCOUNTER — Ambulatory Visit: Payer: 59 | Admitting: Psychiatry

## 2019-06-12 ENCOUNTER — Other Ambulatory Visit: Payer: Self-pay | Admitting: Physician Assistant

## 2019-06-13 ENCOUNTER — Telehealth: Payer: Self-pay | Admitting: Physician Assistant

## 2019-06-13 NOTE — Telephone Encounter (Signed)
I haven't received any FMLA for her.

## 2019-06-13 NOTE — Telephone Encounter (Signed)
Pt called to check status on forms she brought in last week to complete and fax. Also, HR is requesting  FMLA paperwork to be completed. FMLA expires 2019-06-29 by PCP due to Father's death. Asking if you have received FMLA paperwork from HR?  Should have been faxed from HR. Patient advised to leave message if needed. [phone]

## 2019-06-13 NOTE — Telephone Encounter (Signed)
Left  pt a detailed message and let her know we did not receive any papers. I left our fax number for her as well.

## 2019-06-13 NOTE — Telephone Encounter (Signed)
I've not seen anything on her.

## 2019-06-14 DIAGNOSIS — Z0289 Encounter for other administrative examinations: Secondary | ICD-10-CM

## 2019-06-14 NOTE — Telephone Encounter (Signed)
Yes. I t has been faxed. Thank you.

## 2019-06-14 NOTE — Telephone Encounter (Signed)
Pt dropped off paperwork to [place] for workplace accomodation before Christmas. This paperwork was in [place] office so I gave it to [place] to work on. Also I notified pt that we have not received FMLA forms from her employer yet. She will need to have them fax them to [place].

## 2019-06-14 NOTE — Telephone Encounter (Signed)
[place] B[place], did I give you everything you need for her?  Thanks.

## 2019-06-19 ENCOUNTER — Ambulatory Visit: Payer: 59 | Admitting: Psychiatry

## 2019-06-22 ENCOUNTER — Telehealth: Payer: Self-pay | Admitting: Physician Assistant

## 2019-06-22 ENCOUNTER — Other Ambulatory Visit: Payer: Self-pay

## 2019-06-22 MED ORDER — VORTIOXETINE HBR 5 MG PO TABS: 7.5000 mg | ORAL_TABLET | Freq: Every day | ORAL | 1 refills | Status: DC

## 2019-06-22 NOTE — Telephone Encounter (Signed)
No request received from pharmacy but will send her refill in for Trintellix

## 2019-06-22 NOTE — Telephone Encounter (Signed)
Patient called and said that she needs a refill on her trintellix. She is completely out. Pharmacy said that they sent [place] a request but never hearxd back from [place]. [facility] [address]

## 2019-06-23 ENCOUNTER — Ambulatory Visit (INDEPENDENT_AMBULATORY_CARE_PROVIDER_SITE_OTHER): Payer: 59 | Admitting: Psychiatry

## 2019-06-23 ENCOUNTER — Encounter: Payer: Self-pay | Admitting: Psychiatry

## 2019-06-23 ENCOUNTER — Other Ambulatory Visit: Payer: Self-pay

## 2019-06-23 DIAGNOSIS — F329 Major depressive disorder, single episode, unspecified: Secondary | ICD-10-CM | POA: Diagnosis not present

## 2019-06-23 MED ORDER — VORTIOXETINE HBR 10 MG PO TABS: 10.0000 mg | ORAL_TABLET | Freq: Every day | ORAL | 0 refills | Status: DC
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
## 2019-06-23 NOTE — Telephone Encounter (Signed)
Ok thanks 

## 2019-06-23 NOTE — Telephone Encounter (Signed)
Noted will increase dose back up to Trintellix 10 mg, per last visit okay to adjust if needed.

## 2019-06-23 NOTE — Telephone Encounter (Signed)
Pt came in today for therapist appt in tears asking to leave Trintellix dose @ 10 mg for now. She is completely out and knows you sent in 7.5 mg dose. [facility] [address] [facility].

## 2019-06-29 ENCOUNTER — Ambulatory Visit (INDEPENDENT_AMBULATORY_CARE_PROVIDER_SITE_OTHER): Payer: 59 | Admitting: Physician Assistant

## 2019-06-29 ENCOUNTER — Encounter: Payer: Self-pay | Admitting: Physician Assistant

## 2019-06-29 DIAGNOSIS — F411 Generalized anxiety disorder: Secondary | ICD-10-CM | POA: Diagnosis not present

## 2019-06-29 DIAGNOSIS — G4709 Other insomnia: Secondary | ICD-10-CM

## 2019-06-29 DIAGNOSIS — F329 Major depressive disorder, single episode, unspecified: Secondary | ICD-10-CM | POA: Diagnosis not present

## 2019-06-29 DIAGNOSIS — F909 Attention-deficit hyperactivity disorder, unspecified type: Secondary | ICD-10-CM | POA: Diagnosis not present

## 2019-06-29 DIAGNOSIS — F4310 Post-traumatic stress disorder, unspecified: Secondary | ICD-10-CM | POA: Diagnosis not present

## 2019-06-29 MED ORDER — ALPRAZOLAM 0.5 MG PO TABS: 0.5000 mg | ORAL_TABLET | Freq: Two times a day (BID) | ORAL | 2 refills | Status: DC | PRN

## 2019-06-29 MED ORDER — VORTIOXETINE HBR 10 MG PO TABS: 10.0000 mg | ORAL_TABLET | Freq: Every day | ORAL | 2 refills | Status: DC
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
## 2019-07-03 ENCOUNTER — Encounter: Payer: Self-pay | Admitting: Psychiatry

## 2019-07-03 ENCOUNTER — Other Ambulatory Visit: Payer: Self-pay

## 2019-07-03 ENCOUNTER — Ambulatory Visit (INDEPENDENT_AMBULATORY_CARE_PROVIDER_SITE_OTHER): Payer: 59 | Admitting: Psychiatry

## 2019-07-03 DIAGNOSIS — F320 Major depressive disorder, single episode, mild: Secondary | ICD-10-CM | POA: Diagnosis not present

## 2019-07-03 NOTE — Progress Notes (Signed)
      [facility] Counselor/Therapist Progress Note  Patient ID: Sherry Ewing, MRN: [id number],    Date: 07/03/2019  Time Spent: 45 minutes   Treatment Type: Individual Therapy  Reported Symptoms: sad, irritable  Mental Status Exam:  Appearance:   Casual     Behavior:  Appropriate  Motor:  Normal  Speech/Language:   Clear and Coherent  Affect:  Appropriate  Mood:  irritable and sad  Thought process:  normal  Thought content:    WNL  Sensory/Perceptual disturbances:    WNL  Orientation:  oriented to person, place, time/date and situation  Attention:  Good  Concentration:  Good  Memory:  WNL  Fund of knowledge:   Good  Insight:    Good  Judgment:   Good  Impulse Control:  Good   Risk Assessment: Danger to Self:  No Self-injurious Behavior: No Danger to Others: No Duty to Warn:no Physical Aggression / Violence:No  Access to Firearms a concern: No  Gang Involvement:No   Subjective: The client states that she is now on final notice.  Because it was a safety violation at her work if anything else happens she will be terminated.  The client is sad and irritable about this today.  I asked her what her other options could be?  "I could care less."  I told the client that I would play devils advocate and say if she had to get a job what would she do?  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  The client is unsure if she can go back to school because of her existing school loans.  She does not see any other options available to her.  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 I discussed with the client about continuing to practice mood independent behavior especially when it comes to her exercise.  I also encouraged the client to increase her social engagement.  We also discussed the need to work on her hobbies.  She is building a raised bed garden behind her house.  This seems to give the client some motivation.  I encouraged the  client to continue working on that.  Interventions: Assertiveness/Communication, Motivational Interviewing, Solution-Oriented/Positive Psychology and Insight-Oriented  Diagnosis:   ICD-10-CM   1. Depression, major, single episode, mild (HCC)  F32.0     Plan: Positive self talk, mood independent behavior, assertiveness, boundaries, engaged activities, social network.  Gelene Mink Savion Washam, [facility]

## 2019-07-17 ENCOUNTER — Ambulatory Visit (INDEPENDENT_AMBULATORY_CARE_PROVIDER_SITE_OTHER): Payer: 59 | Admitting: Psychiatry

## 2019-07-17 ENCOUNTER — Other Ambulatory Visit: Payer: Self-pay

## 2019-07-17 ENCOUNTER — Encounter: Payer: Self-pay | Admitting: Psychiatry

## 2019-07-17 DIAGNOSIS — F320 Major depressive disorder, single episode, mild: Secondary | ICD-10-CM
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
## 2019-07-31 ENCOUNTER — Other Ambulatory Visit: Payer: Self-pay

## 2019-07-31 ENCOUNTER — Ambulatory Visit (INDEPENDENT_AMBULATORY_CARE_PROVIDER_SITE_OTHER): Payer: 59 | Admitting: Psychiatry

## 2019-07-31 ENCOUNTER — Encounter: Payer: Self-pay | Admitting: Psychiatry

## 2019-07-31 DIAGNOSIS — F320 Major depressive disorder, single episode, mild: Secondary | ICD-10-CM | POA: Diagnosis not present

## 2019-07-31 NOTE — Progress Notes (Signed)
      [facility] Counselor/Therapist Progress Note  Patient ID: Sherry Ewing, MRN: [id number],    Date: 07/31/2019  Time Spent: 50 minutes   Treatment Type: Individual Therapy  Reported Symptoms: sad, anxious  Mental Status Exam:  Appearance:   Casual     Behavior:  Appropriate  Motor:  Normal  Speech/Language:   Clear and Coherent  Affect:  Appropriate  Mood:  anxious and sad  Thought process:  normal  Thought content:    WNL  Sensory/Perceptual disturbances:    WNL  Orientation:  oriented to person, place, time/date and situation  Attention:  Good  Concentration:  Good  Memory:  WNL  Fund of knowledge:   Good  Insight:    Good  Judgment:   Good  Impulse Control:  Good   Risk Assessment: Danger to Self:  No Self-injurious Behavior: No Danger to Others: No Duty to Warn:no Physical Aggression / Violence:No  Access to Firearms a concern: No  Gang Involvement:No   Subjective: The client states that Valentine's Day was not as triggering for her as she thought it would be.  "I survived the day."  She did not search out any information about her ex-girlfriend.  This is a change for the positive for the client.  Because her mood has been up and down since the break-up her mother wanted the client to get her dog certified as an emotional support dog.  I told the client I had no information on that but she could check with the [facility], the [employer], or her International aid/development worker.  They would be the best sources of that information. The client recently got her second vaccine for which she was grateful.  She did get very sick with side effects for 12 hours.  She also reports over the last few months of waking up in the middle of the night very nauseated and having to throw up.  I encouraged the client for her to follow-up with her gastroenterologist.  She stated that she would. I discussed with the client what she is doing to take care of herself?  We discussed her social  network which is weak at the time.  We also discussed exercise.  The weather currently does not allow the client to walk much.  She stated that she will try to figure out some alternative ways of getting some exercise.  She continues to use positive self talk successfully and mood independent behavior.  Interventions: Assertiveness/Communication, Motivational Interviewing, Solution-Oriented/Positive Psychology and Insight-Oriented  Diagnosis:   ICD-10-CM   1. Depression, major, single episode, mild (HCC)  F32.0     Plan: Exercise, positive self talk, self-care, mood independent behavior, assertiveness, boundaries, social network.Sherry Ewing, [facility]

## 2019-08-09 ENCOUNTER — Ambulatory Visit: Payer: 59 | Admitting: Physician Assistant

## 2019-08-14 ENCOUNTER — Ambulatory Visit: Payer: 59 | Admitting: Psychiatry

## 2019-08-18 ENCOUNTER — Other Ambulatory Visit: Payer: Self-pay | Admitting: Physician Assistant

## 2019-08-18 NOTE — Telephone Encounter (Signed)
Was due back end of Feb. Not scheduled yet

## 2019-08-24 ENCOUNTER — Other Ambulatory Visit: Payer: Self-pay | Admitting: Physician Assistant

## 2019-08-26 ENCOUNTER — Other Ambulatory Visit: Payer: Self-pay | Admitting: Physician Assistant

## 2019-08-28 ENCOUNTER — Ambulatory Visit: Payer: 59 | Admitting: Psychiatry

## 2019-09-11 ENCOUNTER — Encounter: Payer: Self-pay | Admitting: Psychiatry

## 2019-09-11 ENCOUNTER — Ambulatory Visit (INDEPENDENT_AMBULATORY_CARE_PROVIDER_SITE_OTHER): Payer: 59 | Admitting: Psychiatry

## 2019-09-11 ENCOUNTER — Other Ambulatory Visit: Payer: Self-pay

## 2019-09-11 DIAGNOSIS — F320 Major depressive disorder, single episode, mild: Secondary | ICD-10-CM

## 2019-09-11 NOTE — Progress Notes (Signed)
      [facility] Counselor/Therapist Progress Note  Patient ID: Breia Ocampo, MRN: [id number],    Date: 09/11/2019  Time Spent: 50 minutes   Treatment Type: Individual Therapy  Reported Symptoms: lack of motivation, loss of interest, depression  Mental Status Exam:  Appearance:   Casual     Behavior:  Appropriate  Motor:  Normal  Speech/Language:   Clear and Coherent  Affect:  Appropriate  Mood:  depressed  Thought process:  normal  Thought content:    WNL  Sensory/Perceptual disturbances:    WNL  Orientation:  oriented to person, place, time/date and situation  Attention:  Good  Concentration:  Good  Memory:  WNL  Fund of knowledge:   Good  Insight:    Good  Judgment:   Good  Impulse Control:  Good   Risk Assessment: Danger to Self:  No Self-injurious Behavior: No Danger to Others: No Duty to Warn:no Physical Aggression / Violence:No  Access to Firearms a concern: No  Gang Involvement:No   Subjective: The client states that she has had more and more nausea and an increase in her osteoarthritis.  "I am disgusted with myself that I am 100 pounds overweight.  I weigh the most I ever have."  We discussed the need for the client to make an appointment with her primary care physician for full physical.  The client thinks that it Antwoine Zorn be her gallbladder or acid reflux or possibly the medications that she is taking such as Trintellix.  I suggested the client talk about a referral to the bariatric clinic with her physician.  This way she would have some accountability as well as direction on how to manage towards weight loss.  She also notes that she has no motivation to change things.  She has some mild interest in doing things but not enough to change what she is currently doing.  We discussed exercise that she had been doing such as walking and taking a more active role with her diet.  The client Zanae Kuehnle do a 5-day juice cleanse to see if she can get herself started.  We discussed  that she would have to practice more mood independent behavior.  The client knows that she needs to do this.  She will also need more radical acceptance with where she is at this point in her life.  She agreed.  Interventions: Mindfulness Meditation, Motivational Interviewing, Solution-Oriented/Positive Psychology and Insight-Oriented  Diagnosis:   ICD-10-CM   1. Depression, major, single episode, mild (HCC)  F32.0     Plan: Mood independent behavior, radical acceptance, self-care, exercise, appointment with primary care physician.  Gelene Mink Kyana Aicher, [facility]

## 2019-09-14 ENCOUNTER — Other Ambulatory Visit: Payer: Self-pay | Admitting: Physician Assistant

## 2019-09-14 NOTE — Telephone Encounter (Signed)
Has apt 04/10

## 2019-09-25 ENCOUNTER — Ambulatory Visit: Payer: 59 | Admitting: Psychiatry

## 2019-09-26 ENCOUNTER — Encounter: Payer: Self-pay | Admitting: Physician Assistant

## 2019-09-26 ENCOUNTER — Ambulatory Visit (INDEPENDENT_AMBULATORY_CARE_PROVIDER_SITE_OTHER): Payer: 59 | Admitting: Physician Assistant

## 2019-09-26 DIAGNOSIS — F329 Major depressive disorder, single episode, unspecified: Secondary | ICD-10-CM

## 2019-09-26 DIAGNOSIS — G4709 Other insomnia: Secondary | ICD-10-CM | POA: Diagnosis not present

## 2019-09-26 DIAGNOSIS — F909 Attention-deficit hyperactivity disorder, unspecified type: Secondary | ICD-10-CM

## 2019-09-26 DIAGNOSIS — F411 Generalized anxiety disorder: Secondary | ICD-10-CM | POA: Diagnosis not present

## 2019-09-26 DIAGNOSIS — R112 Nausea with vomiting, unspecified: Secondary | ICD-10-CM

## 2019-09-26 MED ORDER — LISDEXAMFETAMINE DIMESYLATE 40 MG PO CAPS: 40.0000 mg | ORAL_CAPSULE | ORAL | 0 refills | Status: DC

## 2019-09-26 NOTE — Progress Notes (Signed)
[facility] Med Check  Patient ID: Sherry Ewing,  MRN: [id number]  PCP: Harlan Stains, MD  Date of Evaluation: 09/26/2019 Time spent:30 minutes  Chief Complaint:  Chief Complaint    Depression; Anxiety; Insomnia     Virtual Visit via Telephone Note  I connected with patient by a video enabled telemedicine application or telephone, with their informed consent, and verified patient privacy and that I am speaking with the correct person using two identifiers.  I am private, in my office and the patient is home.  I discussed the limitations, risks, security and privacy concerns of performing an evaluation and management service by telephone and the availability of in person appointments. I also discussed with the patient that there Ewing be a patient responsible charge related to this service. The patient expressed understanding and agreed to proceed.   I discussed the assessment and treatment plan with the patient. The patient was provided an opportunity to ask questions and all were answered. The patient agreed with the plan and demonstrated an understanding of the instructions.   The patient was advised to call back or seek an in-person evaluation if the symptoms worsen or if the condition fails to improve as anticipated.  I provided 30 minutes of non-face-to-face time during this encounter.  HISTORY/CURRENT STATUS: HPI For routine med check.  For a few months now, she's been having n/v a few hours of taking all her meds before she goes to bed.  (She works nightshift) this was not happening before and there have been no medication changes prior to this time.  She has been on these current psychiatric medications for approximately a year and she has always taken these medications right before bedtime.  For about 4 nights now, she has started taking all of her medications before bed with the exception of the Trintellix which she takes during the middle of her night, around 2 PM, and  that has seemed to help with the nausea vomiting some.  It has not really been long enough to know whether this is the direct cause or not.  States she has not been having a lot of reflux so has not been taking the Protonix routinely.  Feels that the medications are still working.  She is able to enjoy things.  Energy and motivation are good.  Does not cry easily.  Is not isolating.  Appetite and weight are stable.  Denies suicidal or homicidal thoughts.  The restlessness of her legs is still a problem if she does not take the ropinirole.  But she does take it every evening and it is effective.  There was one time while she was still at work about 3 AM, her legs started feeling odd, like she was going to need to move them to make them feel better.  She took the ropinirole at that time, it did not make her sleepy, and it helped with those symptoms.  Anxiety is still an issue but taking the Xanax helps.  She is also using coping techniques that help when she starts feeling panicky.  She takes the Vyvanse only as needed.  She is still using the medication from her prescription in November.  It works well to help her be able to focus and finish things in a timely manner.  Patient denies increased energy with decreased need for sleep, no increased talkativeness, no racing thoughts, no impulsivity or risky behaviors, no increased spending, no increased libido, no grandiosity, no increased irritability or anger, and no hallucinations.  Denies dizziness, syncope, seizures, numbness, tingling, tremor, tics, unsteady gait, slurred speech, confusion. Denies muscle or joint pain, stiffness, or dystonia.  Individual Medical History/ Review of Systems: Changes? :Yes See above  Past medications for mental health diagnoses include: Paxil, Buspar, Ambien, Trazodone  Allergies: Seroquel [quetiapine fumarate] and Tramadol hcl  Current Medications:  Current Outpatient Medications:  .  ALPRAZolam (XANAX) 0.5 MG  tablet, Take 1 tablet (0.5 mg total) by mouth 2 (two) times daily as needed for anxiety., Disp: 40 tablet, Rfl: 2 .  BREO ELLIPTA 100-25 MCG/INH AEPB, , Disp: , Rfl:  .  eletriptan (RELPAX) 40 MG tablet, , Disp: , Rfl:  .  lisdexamfetamine (VYVANSE) 40 MG capsule, Take 1 capsule (40 mg total) by mouth every morning., Disp: 30 capsule, Rfl: 0 .  pantoprazole (PROTONIX) 40 MG tablet, Take 40 mg by mouth daily as needed. , Disp: , Rfl:  .  rOPINIRole (REQUIP) 2 MG tablet, Take 2 mg by mouth at bedtime., Disp: , Rfl:  .  temazepam (RESTORIL) 15 MG capsule, TAKE 1 TO 2 CAPSULES BY MOUTH AT BEDTIME, Disp: 10 capsule, Rfl: 0 .  vortioxetine HBr (TRINTELLIX) 10 MG TABS tablet, Take 1 tablet (10 mg total) by mouth daily., Disp: 30 tablet, Rfl: 2 .  HYDROcodone-acetaminophen (NORCO/VICODIN) 5-325 MG per tablet, Take 1-2 tablets by mouth every 6 (six) hours as needed for pain. (Patient not taking: Reported on 05/10/2018), Disp: 20 tablet, Rfl: 0 .  ibuprofen (ADVIL,MOTRIN) 800 MG tablet, Take 1 tablet (800 mg total) by mouth every 8 (eight) hours as needed for moderate pain. (Patient not taking: Reported on 09/26/2019), Disp: 21 tablet, Rfl: 0 .  methocarbamol (ROBAXIN) 500 MG tablet, Take 1 tablet (500 mg total) by mouth every 8 (eight) hours as needed for muscle spasms. (Patient not taking: Reported on 05/10/2018), Disp: 20 tablet, Rfl: 0 .  norgestrel-ethinyl estradiol (LO/OVRAL) 0.3-30 MG-MCG per tablet, Take 1 tablet by mouth daily.  , Disp: , Rfl:  Medication Side Effects: none  Family Medical/ Social History: Changes? No  MENTAL HEALTH EXAM:  There were no vitals taken for this visit.There is no height or weight on file to calculate BMI.  General Appearance: Unable to assess  Eye Contact:  Unable to assess  Speech:  Clear and Coherent and Normal Rate  Volume:  Normal  Mood:  Euthymic  Affect:  Unable to assess  Thought Process:  Goal Directed and Descriptions of Associations: Intact   Orientation:  Full (Time, Place, and Person)  Thought Content: Logical   Suicidal Thoughts:  No  Homicidal Thoughts:  No  Memory:  WNL  Judgement:  Good  Insight:  Good  Psychomotor Activity:  Unable to assess  Concentration:  Concentration: Good and Attention Span: Good  Recall:  Good  Fund of Knowledge: Good  Language: Good  Assets:  Desire for Improvement  ADL's:  Intact  Cognition: WNL  Prognosis:  Good    DIAGNOSES:    ICD-10-CM   1. Other insomnia  G47.09   2. Attention deficit hyperactivity disorder (ADHD), unspecified ADHD type  F90.9   3. Reactive depression  F32.9   4. Generalized anxiety disorder  F41.1   5. Nausea and vomiting, intractability of vomiting not specified, unspecified vomiting type  R11.2     Receiving Psychotherapy: Yes With Sherry Ewing, [facility]   RECOMMENDATIONS:  PDMP was reviewed. I spent 30 minutes with her. For now she will continue to take all of her routine medications prior to  going to bed, with the exception of the Trintellix which she will take in the middle of her night.  It does seem to have helped for the few days that she has tried it that way.   She also has an appointment to see her PCP next week concerning this new problem. Continue Xanax 0.5 mg 1 p.o. twice daily as needed.  Do not take with Vyvanse. Continue Vyvanse 40 mg every morning as needed.  Do not take with Xanax. Continue temazepam 15 mg, 1-2 nightly as needed.  (She works night shift.) Continue Trintellix 10 mg daily.   Continue ropinirole 2 mg daily at bedtime. Continue therapy with Sherry Ewing, [facility]. Return in 4 weeks.  Sherry Overly, Sherry Ewing

## 2019-09-28 ENCOUNTER — Telehealth: Payer: Self-pay | Admitting: Physician Assistant

## 2019-09-28 ENCOUNTER — Other Ambulatory Visit: Payer: Self-pay | Admitting: Physician Assistant

## 2019-09-28 MED ORDER — TEMAZEPAM 15 MG PO CAPS: 15.0000 mg | ORAL_CAPSULE | Freq: Every evening | ORAL | 2 refills | Status: DC | PRN

## 2019-09-28 NOTE — Telephone Encounter (Signed)
Sherry Ewing called because her refill for temazepam was for only #10 tablets.  Should be #60. Is there a reason you only gave her #10?  If so please let her know the reason.  If it was a mistake, please in send in new script for #60.  [facility] [address]

## 2019-09-28 NOTE — Telephone Encounter (Signed)
It was my mistake.  I sent in corrected Rx.  Sorry about that.

## 2019-10-09 ENCOUNTER — Ambulatory Visit: Payer: 59 | Admitting: Psychiatry

## 2019-10-23 ENCOUNTER — Ambulatory Visit: Payer: 59 | Admitting: Psychiatry

## 2019-10-23 ENCOUNTER — Other Ambulatory Visit: Payer: Self-pay | Admitting: Physician Assistant

## 2019-10-26 ENCOUNTER — Other Ambulatory Visit: Payer: Self-pay | Admitting: Physician Assistant

## 2019-11-06 ENCOUNTER — Encounter: Payer: Self-pay | Admitting: Psychiatry

## 2019-11-06 ENCOUNTER — Ambulatory Visit (INDEPENDENT_AMBULATORY_CARE_PROVIDER_SITE_OTHER): Payer: 59 | Admitting: Psychiatry

## 2019-11-06 ENCOUNTER — Other Ambulatory Visit: Payer: Self-pay

## 2019-11-06 DIAGNOSIS — F320 Major depressive disorder, single episode, mild: Secondary | ICD-10-CM | POA: Diagnosis not present

## 2019-11-06 NOTE — Progress Notes (Signed)
      [facility] Counselor/Therapist Progress Note  Patient ID: Sherry Ewing, MRN: [id number],    Date: 11/06/2019  Time Spent: 50 minutes   Treatment Type: Individual Therapy  Reported Symptoms: sad  Mental Status Exam:  Appearance:   Casual     Behavior:  Appropriate  Motor:  Normal  Speech/Language:   Clear and Coherent  Affect:  Appropriate  Mood:  sad  Thought process:  normal  Thought content:    WNL  Sensory/Perceptual disturbances:    WNL  Orientation:  oriented to person, place, time/date and situation  Attention:  Good  Concentration:  Good  Memory:  WNL  Fund of knowledge:   Good  Insight:    Good  Judgment:   Good  Impulse Control:  Good   Risk Assessment: Danger to Self:  No Self-injurious Behavior: No Danger to Others: No Duty to Warn:no Physical Aggression / Violence:No  Access to Firearms a concern: No  Gang Involvement:No   Subjective: The client states that she has been sad around her dad's birthday.  She [place] beach to see her mom and then flew to [place] to see her adopted sister.  They have both been discussing how to get their mother some psychiatric help.  She has a history of bipolar disorder but she is currently very depressed.  Client states her mother is very obstinate about seeing someone.  We discussed the client contacting her cousin who is the CEO of [employer] which is a large medical system in the [facility] area.  She will see if he has a recommendation for psychiatrist for her. The client continues to let go of her past relationship.  She has more positive attitude about her ex-girlfriend.  I encouraged the client to try to increase her social network and engage in activities that help distract her.  She stated she has been doing that and recently completed 3 raised beds that she will place in her yard to grow vegetables.  This is more activity that the client has done a long time. I asked the client how her nausea  has been with her medication?  She states in the last month and a half it has completely gone away.  She attributes the COVID shot interacting somehow with her medication causing the nausea.  She stated that the nausea started right after her first COVID shot.  It stayed with her through the second and finally resolved the early part of April.  We discussed continuing on self-care staying on a regular schedule with [name], PA-C.  She agreed.  Interventions: Assertiveness/Communication, Motivational Interviewing, Solution-Oriented/Positive Psychology and Insight-Oriented  Diagnosis:   ICD-10-CM   1. Depression, major, single episode, mild (HCC)  F32.0     Plan: Mood independent behavior, social network, outdoor activities, hobbies, positive self talk, assertiveness, boundaries, self-care.  Gelene Mink Dquan Cortopassi, [facility]

## 2019-11-20 ENCOUNTER — Encounter: Payer: Self-pay | Admitting: Psychiatry

## 2019-11-20 ENCOUNTER — Other Ambulatory Visit: Payer: Self-pay

## 2019-11-20 ENCOUNTER — Ambulatory Visit (INDEPENDENT_AMBULATORY_CARE_PROVIDER_SITE_OTHER): Payer: 59 | Admitting: Psychiatry

## 2019-11-20 DIAGNOSIS — F320 Major depressive disorder, single episode, mild: Secondary | ICD-10-CM | POA: Diagnosis not present

## 2019-11-20 NOTE — Progress Notes (Signed)
      [facility] Counselor/Therapist Progress Note  Patient ID: Sherry Ewing, MRN: [id number],    Date: 11/20/2019  Time Spent: 45 minutes   Treatment Type: Individual Therapy  Reported Symptoms: sad, irritated  Mental Status Exam:  Appearance:   Casual     Behavior:  Appropriate  Motor:  Normal  Speech/Language:   Clear and Coherent  Affect:  Appropriate  Mood:  irritable and sad  Thought process:  normal  Thought content:    WNL  Sensory/Perceptual disturbances:    WNL  Orientation:  oriented to person, place, time/date and situation  Attention:  Good  Concentration:  Good  Memory:  WNL  Fund of knowledge:   Good  Insight:    Good  Judgment:   Good  Impulse Control:  Good   Risk Assessment: Danger to Self:  No Self-injurious Behavior: No Danger to Others: No Duty to Warn:no Physical Aggression / Violence:No  Access to Firearms a concern: No  Gang Involvement:No   Subjective: The client is sad and irritated today.  "My mom is borderline suicide."  I discussed this in more detail with the client and what she means is that her mother is having chronic thoughts of death.  They are passive but she has no plan or current intent.  The client states her mother was just prescribed Prozac which she hopes will help her with her ongoing grief.  "Everything falls on me.  Either me or my sister calls her each day." The client states that she is extremely overwhelmed and stressed.  She is frustrated that she has been stuck with "this mess with my mom."  I discussed with the client what she was doing to take care of herself.  She has been gardening building raised beds for herself at her rental house.  She is also increased her socialization.  Since the start of the Trintellix her affect and mood have been brighter.  This is been helpful since she and her mother have had a contentious relationship in the past.  I recommended that she get her mother into some hospice grief counseling.   They have both individual and group modalities.  The client felt this was a good idea and she would pursue that with her mother.  Interventions: Assertiveness/Communication, Motivational Interviewing, Solution-Oriented/Positive Psychology and Insight-Oriented  Diagnosis:   ICD-10-CM   1. Depression, major, single episode, mild (HCC)  F32.0     Plan: Mood independent behavior, proactive with her mother, assertiveness, boundaries, self-care, engaged activities, social network.  Gelene Mink Brit Carbonell, [facility]

## 2019-11-29 ENCOUNTER — Ambulatory Visit (INDEPENDENT_AMBULATORY_CARE_PROVIDER_SITE_OTHER): Payer: 59 | Admitting: Physician Assistant

## 2019-11-29 ENCOUNTER — Encounter: Payer: Self-pay | Admitting: Physician Assistant

## 2019-11-29 ENCOUNTER — Other Ambulatory Visit: Payer: Self-pay

## 2019-11-29 DIAGNOSIS — F4310 Post-traumatic stress disorder, unspecified: Secondary | ICD-10-CM | POA: Diagnosis not present

## 2019-11-29 DIAGNOSIS — F909 Attention-deficit hyperactivity disorder, unspecified type: Secondary | ICD-10-CM

## 2019-11-29 DIAGNOSIS — F329 Major depressive disorder, single episode, unspecified: Secondary | ICD-10-CM

## 2019-11-29 DIAGNOSIS — G4709 Other insomnia: Secondary | ICD-10-CM

## 2019-11-29 DIAGNOSIS — G2581 Restless legs syndrome: Secondary | ICD-10-CM

## 2019-11-29 MED ORDER — VORTIOXETINE HBR 10 MG PO TABS: 10.0000 mg | ORAL_TABLET | Freq: Every day | ORAL | 5 refills | Status: DC

## 2019-11-29 MED ORDER — TEMAZEPAM 15 MG PO CAPS: 15.0000 mg | ORAL_CAPSULE | Freq: Every evening | ORAL | 5 refills | Status: DC | PRN

## 2019-11-29 MED ORDER — GABAPENTIN 600 MG PO TABS: ORAL_TABLET | ORAL | 1 refills | Status: DC

## 2019-11-29 MED ORDER — LISDEXAMFETAMINE DIMESYLATE 40 MG PO CAPS: 40.0000 mg | ORAL_CAPSULE | ORAL | 0 refills | Status: DC

## 2019-11-29 MED ORDER — ALPRAZOLAM 0.5 MG PO TABS: 0.5000 mg | ORAL_TABLET | Freq: Two times a day (BID) | ORAL | 5 refills | Status: DC | PRN

## 2019-11-29 NOTE — Progress Notes (Signed)
[facility] Med Check  Patient ID: Sherry Ewing,  MRN: [id number]  PCP: Sherry Stains, MD  Date of Evaluation: 11/29/2019 Time spent:30 minutes  Chief Complaint:  Chief Complaint    Anxiety; Depression      HISTORY/CURRENT STATUS: For routine med check.  Has seen her PCP about n/v.  Was told to take Protonix daily, not just prn. She still has n/v after she takes the Ropinerole, if she's eaten. She works 7p-7a as Audiological scientist.  This is the only thing she can figure out that could be causing the vomiting.  Has been on Ropinerole for at least 6-7 years and the vomiting started about 6 months ago. "I don't know if that's it.  I just don't know.  And it doesn't happen every night."   Feels that the medications are still working.  She is able to enjoy things.  Energy and motivation are good.  Does not cry easily.  Is not isolating.  Appetite and weight are stable.  Denies suicidal or homicidal thoughts.  The restlessness of her legs is still a problem if she does not take the ropinirole. That's the only thing she's ever been on for RLS. "I'm open to suggestions."  Mom has Bipolar Disorder and she's worried about her.  She lives at [facility], so it's a long way away.  Anxiety is still an issue but taking the Xanax helps.  She is also using coping techniques that help when she starts feeling panicky.  She takes the Vyvanse only as needed.  It works well to help her be able to focus and finish things in a timely manner.  Patient denies increased energy with decreased need for sleep, no increased talkativeness, no racing thoughts, no impulsivity or risky behaviors, no increased spending, no increased libido, no grandiosity, no increased irritability or anger, and no hallucinations.  Denies dizziness, syncope, seizures, numbness, tingling, tremor, tics, unsteady gait, slurred speech, confusion. Denies muscle or joint pain, stiffness, or dystonia.  Individual Medical History/  Review of Systems: Changes? :No    Past medications for mental health diagnoses include: Paxil, Buspar, Ambien, Trazodone  Allergies: Seroquel [quetiapine fumarate] and Tramadol hcl  Current Medications:  Current Outpatient Medications:  .  ALPRAZolam (XANAX) 0.5 MG tablet, Take 1 tablet (0.5 mg total) by mouth 2 (two) times daily as needed for anxiety., Disp: 40 tablet, Rfl: 5 .  BREO ELLIPTA 100-25 MCG/INH AEPB, , Disp: , Rfl:  .  eletriptan (RELPAX) 40 MG tablet, , Disp: , Rfl:  .  lisdexamfetamine (VYVANSE) 40 MG capsule, Take 1 capsule (40 mg total) by mouth every morning., Disp: 30 capsule, Rfl: 0 .  pantoprazole (PROTONIX) 40 MG tablet, Take 40 mg by mouth daily as needed. , Disp: , Rfl:  .  temazepam (RESTORIL) 15 MG capsule, Take 1-2 capsules (15-30 mg total) by mouth at bedtime as needed for sleep., Disp: 60 capsule, Rfl: 5 .  vortioxetine HBr (TRINTELLIX) 10 MG TABS tablet, Take 1 tablet (10 mg total) by mouth daily., Disp: 30 tablet, Rfl: 5 .  gabapentin (NEURONTIN) 600 MG tablet, Take 1 po approx 4 hours before bed., Disp: 30 tablet, Rfl: 1 .  HYDROcodone-acetaminophen (NORCO/VICODIN) 5-325 MG per tablet, Take 1-2 tablets by mouth every 6 (six) hours as needed for pain. (Patient not taking: Reported on 05/10/2018), Disp: 20 tablet, Rfl: 0 .  ibuprofen (ADVIL,MOTRIN) 800 MG tablet, Take 1 tablet (800 mg total) by mouth every 8 (eight) hours as needed for moderate pain. (Patient not  taking: Reported on 09/26/2019), Disp: 21 tablet, Rfl: 0 .  methocarbamol (ROBAXIN) 500 MG tablet, Take 1 tablet (500 mg total) by mouth every 8 (eight) hours as needed for muscle spasms. (Patient not taking: Reported on 05/10/2018), Disp: 20 tablet, Rfl: 0 .  norgestrel-ethinyl estradiol (LO/OVRAL) 0.3-30 MG-MCG per tablet, Take 1 tablet by mouth daily.  , Disp: , Rfl:  Medication Side Effects: none  Family Medical/ Social History: Changes? No  MENTAL HEALTH EXAM:  There were no vitals taken for this  visit.There is no height or weight on file to calculate BMI.  General Appearance: Casual, Neat, Well Groomed and Obese  Eye Contact:  Good  Speech:  Clear and Coherent and Normal Rate  Volume:  Normal  Mood:  Euthymic  Affect:  Appropriate  Thought Process:  Goal Directed and Descriptions of Associations: Intact  Orientation:  Full (Time, Place, and Person)  Thought Content: Logical   Suicidal Thoughts:  No  Homicidal Thoughts:  No  Memory:  WNL  Judgement:  Good  Insight:  Good  Psychomotor Activity:  Normal  Concentration:  Concentration: Good and Attention Span: Good  Recall:  Good  Fund of Knowledge: Good  Language: Good  Assets:  Desire for Improvement  ADL's:  Intact  Cognition: WNL  Prognosis:  Good    DIAGNOSES:    ICD-10-CM   1. Restless leg syndrome  G25.81   2. Reactive depression  F32.9   3. PTSD (post-traumatic stress disorder)  F43.10   4. Attention deficit hyperactivity disorder (ADHD), unspecified ADHD type  F90.9   5. Other insomnia  G47.09     Receiving Psychotherapy: Yes With Sherry Ewing, [facility]   RECOMMENDATIONS:  PDMP was reviewed. I provided 30 minutes of face-to-face time with her. It is difficult to say whether the ropinirole is now causing nausea and vomiting or not.  Since she does notice a correlation, I think we should at least wean off of it and use another drug for the RLS.  We discussed gabapentin, benefits, risks, side effects and she accepts. Wean off ropinirole by taking 1/2 pill q. at bedtime for 1 week and then stop. Start gabapentin 600 mg, 1 p.o. around 4 hours before bed.  She can go ahead and start this while weaning off the ropinirole. Continue Xanax 0.5 mg 1 p.o. twice daily as needed.  Do not take with Vyvanse. Continue Vyvanse 40 mg every morning as needed.  Do not take with Xanax. Continue temazepam 15 mg, 1-2 nightly as needed.  (She works night shift.) Continue Trintellix 10 mg daily.   Continue therapy with Sherry Ewing, [facility]  C. Return in 4 weeks.  Sherry Overly, PA-C

## 2019-12-04 ENCOUNTER — Ambulatory Visit (INDEPENDENT_AMBULATORY_CARE_PROVIDER_SITE_OTHER): Payer: 59 | Admitting: Psychiatry

## 2019-12-04 ENCOUNTER — Encounter: Payer: Self-pay | Admitting: Psychiatry

## 2019-12-04 ENCOUNTER — Other Ambulatory Visit: Payer: Self-pay

## 2019-12-04 DIAGNOSIS — F320 Major depressive disorder, single episode, mild: Secondary | ICD-10-CM | POA: Diagnosis not present

## 2019-12-04 NOTE — Progress Notes (Signed)
      [facility] Counselor/Therapist Progress Note  Patient ID: Pema Thomure, MRN: [id number],    Date: 12/04/2019  Time Spent: 50 minutes   Treatment Type: Individual Therapy  Reported Symptoms: anxiety, irritable  Mental Status Exam:  Appearance:   Casual     Behavior:  Appropriate  Motor:  Normal  Speech/Language:   Clear and Coherent  Affect:  Appropriate  Mood:  anxious and irritable  Thought process:  normal  Thought content:    WNL  Sensory/Perceptual disturbances:    WNL  Orientation:  oriented to person, place, time/date and situation  Attention:  Good  Concentration:  Good  Memory:  WNL  Fund of knowledge:   Good  Insight:    Good  Judgment:   Good  Impulse Control:  Good   Risk Assessment: Danger to Self:  No Self-injurious Behavior: No Danger to Others: No Duty to Warn:no Physical Aggression / Violence:No  Access to Firearms a concern: No  Gang Involvement:No   Subjective: The client reports that her mood has continued on the positive trend upward.  She has seen an improvement in her sleep.  She has been getting treatment with her chiropractor and trying to get some massages as well.  The client has gotten invested in her raised bed [facility] which has been a positive and engaged activity for her.  She has more motivation to do that work.  She would like to start walking to work towards losing weight but the heat has kept that from happening. The client's mother is having the same issues but the client is responding differently to her.  She is less agitated by her mother's complaints.  She has set clear boundaries with her and work towards doing some problem-solving with her mom.  She is doing well with her extended family also. 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  People are out on maternity leave for injuries which has made things more difficult.  The client has remarkably good  attitude.  She continues to take the Trintellix as prescribed.  I use the bilateral stimulation hand paddles to help reduce the clients overall anxiety from the subjective units of distress of 4 to less than 2.  She also sought increase in her irritability with her mom.  Interventions: Assertiveness/Communication, Motivational Interviewing, Solution-Oriented/Positive Psychology, [employer] Desensitization and Reprocessing (EMDR) and Insight-Oriented  Diagnosis:   ICD-10-CM   1. Depression, major, single episode, mild (HCC)  F32.0     Plan: Mood independent behavior, positive self talk, engaged activities, social network, self-care, work towards exercise, sleep hygiene.  Gelene Mink Sulma Ruffino, [facility]

## 2019-12-28 ENCOUNTER — Ambulatory Visit: Payer: 59 | Admitting: Physician Assistant

## 2020-01-01 ENCOUNTER — Encounter: Payer: Self-pay | Admitting: Psychiatry

## 2020-01-01 ENCOUNTER — Other Ambulatory Visit: Payer: Self-pay

## 2020-01-01 ENCOUNTER — Ambulatory Visit (INDEPENDENT_AMBULATORY_CARE_PROVIDER_SITE_OTHER): Payer: 59 | Admitting: Psychiatry

## 2020-01-01 DIAGNOSIS — F320 Major depressive disorder, single episode, mild: Secondary | ICD-10-CM | POA: Diagnosis not present

## 2020-01-01 NOTE — Progress Notes (Signed)
      [facility] Counselor/Therapist Progress Note  Patient ID: Wave Calzada, MRN: [id number],    Date: 01/01/2020  Time Spent: 45 minutes   Treatment Type: Individual Therapy  Reported Symptoms: sad, grief  Mental Status Exam:  Appearance:   Casual     Behavior:  Appropriate  Motor:  Normal  Speech/Language:   Clear and Coherent  Affect:  Appropriate  Mood:  sad  Thought process:  normal  Thought content:    WNL  Sensory/Perceptual disturbances:    WNL  Orientation:  oriented to person, place, time/date and situation  Attention:  Good  Concentration:  Good  Memory:  WNL  Fund of knowledge:   Good  Insight:    Good  Judgment:   Good  Impulse Control:  Good   Risk Assessment: Danger to Self:  No Self-injurious Behavior: No Danger to Others: No Duty to Warn:no Physical Aggression / Violence:No  Access to Firearms a concern: No  Gang Involvement:No   Subjective: The client states that she has had some significant stressors in her life.  Most recently her truck has become infested with black ants.  She has spent the last few days trying to get rid of them.  One of her good friends father died recently from a drug overdose.  Client states she did not attend the funeral.  It was too close to home due to her father's recent death.  Her friend's father was 34 and died of a heroin overdose.  Her own father died in his early 37s from alcoholism.  The client has not talked to her friend.  Today she was very tearful as she discussed the fact that she has avoided funerals.  When she was cleaning out her truck she found the lock of her father's hair that she and her sister had saved.  Now she is overwhelmed and does not know what to do with that.  I use the bilateral stimulation hand paddles as the client was processing this.  We also used eye-movement around her overwhelmed feelings.  The client did not make any significant progress in those moments reducing her sadness.  We did  discuss that in centuries past people would place hair of loved ones and a locket that they carried around their neck.  The client Generoso Cropper look into this through a local jewelry store.  As she continued to discuss this she did seem to reduce her subjective units of distress from where it started at an 8 to less than 3.  I also discussed with her being proactive with her friends that she is phobic about funerals.  The client agreed.  Interventions: Assertiveness/Communication, Motivational Interviewing, Solution-Oriented/Positive Psychology, [employer] Desensitization and Reprocessing (EMDR) and Insight-Oriented  Diagnosis:   ICD-10-CM   1. Depression, major, single episode, mild (HCC)  F32.0     Plan: Communicating with friends about funeral phobia, get a locket to place her father's hair in, positive self talk, self-care, boundaries especially with mother, assertiveness.  Gelene Mink Saaya Procell, [facility]

## 2020-01-15 ENCOUNTER — Ambulatory Visit: Payer: 59 | Admitting: Psychiatry

## 2020-01-18 ENCOUNTER — Telehealth: Payer: Self-pay | Admitting: Physician Assistant

## 2020-01-18 NOTE — Telephone Encounter (Signed)
I'm so sorry.  Have her take the Xanax as needed.  If she needs more sent in, let me know.

## 2020-01-18 NOTE — Telephone Encounter (Signed)
Received FMLA form from [employer]. Given to nurse to complete.

## 2020-01-18 NOTE — Telephone Encounter (Signed)
Pt called crying to advise her mom passed Friday and you will be receiving FMLA paperwork. Call with questions [phone] pt #

## 2020-01-19 NOTE — Telephone Encounter (Signed)
Left patient detailed message and to call back if she needs more Xanax, and also to give [place] dates to put on FMLA before submitting.

## 2020-01-29 ENCOUNTER — Encounter: Payer: Self-pay | Admitting: Psychiatry

## 2020-01-29 ENCOUNTER — Other Ambulatory Visit: Payer: Self-pay

## 2020-01-29 ENCOUNTER — Ambulatory Visit (INDEPENDENT_AMBULATORY_CARE_PROVIDER_SITE_OTHER): Payer: 59 | Admitting: Psychiatry

## 2020-01-29 DIAGNOSIS — F320 Major depressive disorder, single episode, mild: Secondary | ICD-10-CM

## 2020-01-29 NOTE — Progress Notes (Signed)
°      [facility] Counselor/Therapist Progress Note  Patient ID: Sherry Ewing, MRN: [id number],    Date: 01/29/2020  Time Spent: 50 minutes   Treatment Type: Individual Therapy  Reported Symptoms: grief, depressed  Mental Status Exam:  Appearance:   Casual     Behavior:  Appropriate  Motor:  Normal  Speech/Language:   Clear and Coherent  Affect:  Appropriate  Mood:  depressed and grief  Thought process:  normal  Thought content:    WNL  Sensory/Perceptual disturbances:    WNL  Orientation:  oriented to person, place, time/date and situation  Attention:  Good  Concentration:  Good  Memory:  WNL  Fund of knowledge:   Good  Insight:    Good  Judgment:   Good  Impulse Control:  Good   Risk Assessment: Danger to Self:  No Self-injurious Behavior: No Danger to Others: No Duty to Warn:no Physical Aggression / Violence:No  Access to Firearms a concern: No  Gang Involvement:No   Subjective: The client is very distraught today. 2 weeks ago her mother was [place] dead [place]. The client had not been able to get a hold of her mother for 1-1/2-day.  The client contacted a neighbor.  They checked at her mother's house and found her dead.  The client's father died 1 year ago.  The client was still recovering from his death.  Today the client is barely able to speak through her tears. The client has a cousin who was the medical power of attorney.  She did not know that was the case.  They have been unable to find the will and she has found some confusion with her cousin.  She was wondering if her mother had conversations with him that the client was unaware of.  Today I used the bilateral stimulation hand paddles with the client as she processed her grief, her sadness and her confusion at what has been going on around her mom's death.  The client is overwhelmed and stressed at the magnitude of what it is before her.  She is trying to find out what the state of [place]  requires for an estate without a will.  The client cannot take any more time off from work which also stresses her out.  The client states she was ready to start talking about her dad's death and her brother's death when this happened with her mom.  As the client processed her subjective units of distress went from a 9-7.  She will focus on her self-care, positive self talk and exercise.  Interventions: Assertiveness/Communication, Motivational Interviewing, Solution-Oriented/Positive Psychology, [employer] Desensitization and Reprocessing (EMDR) and Insight-Oriented  Diagnosis:   ICD-10-CM   1. Depression, major, single episode, mild (HCC)  F32.0     Plan: Positive self talk, self-care, conversation with half-sister, exercise, assertiveness, boundaries.  Sherry Ewing, [facility]

## 2020-01-31 DIAGNOSIS — Z0289 Encounter for other administrative examinations: Secondary | ICD-10-CM

## 2020-01-31 NOTE — Telephone Encounter (Signed)
Forms completed and put in front office box

## 2020-01-31 NOTE — Telephone Encounter (Signed)
Forms completed and given to Sherry Ewing to review.  Patient is overdue for an apt, front office aware to get her scheduled.  Her PCP filled out her FMLA last year after the death of her father but her PCP is out of town this week. Paperwork due 02/02/2020

## 2020-02-01 NOTE — Telephone Encounter (Signed)
FMLA forms were faxed 02/01/20

## 2020-02-02 ENCOUNTER — Other Ambulatory Visit: Payer: Self-pay | Admitting: Physician Assistant

## 2020-02-12 ENCOUNTER — Ambulatory Visit (INDEPENDENT_AMBULATORY_CARE_PROVIDER_SITE_OTHER): Payer: 59 | Admitting: Psychiatry

## 2020-02-12 ENCOUNTER — Encounter: Payer: Self-pay | Admitting: Psychiatry

## 2020-02-12 DIAGNOSIS — F320 Major depressive disorder, single episode, mild: Secondary | ICD-10-CM | POA: Diagnosis not present

## 2020-02-12 NOTE — Progress Notes (Signed)
      [facility] Counselor/Therapist Progress Note  Patient ID: Sherry Ewing, MRN: [id number],    Date: 02/12/2020  Time Spent: 50 minbutes   Treatment Type: Individual Therapy  Reported Symptoms: dpression  Mental Status Exam:  Appearance:   Casual     Behavior:  Appropriate  Motor:  Normal  Speech/Language:   Clear and Coherent  Affect:  Tearful  Mood:  depressed  Thought process:  normal  Thought content:    WNL  Sensory/Perceptual disturbances:    WNL  Orientation:  oriented to person, place, time/date and situation  Attention:  Good  Concentration:  Good  Memory:  WNL  Fund of knowledge:   Good  Insight:    Good  Judgment:   Good  Impulse Control:  Good   Risk Assessment: Danger to Self:  No Self-injurious Behavior: No Danger to Others: No Duty to Warn:no Physical Aggression / Violence:No  Access to Firearms a concern: No  Gang Involvement:No   Subjective: Client states she has made no progress with her mom's estate.  "I am tired of all this.  I am tired of being in survival mode."  Client states that her dog had gotten sick.  "I thought I had lost him."  The client began to cry uncontrollably at this point.  She related that her dog had gotten into trashcan and eating things that had caused an impaction of the dog's colon.  The veterinarian was able to remove the impaction.  She is overwhelmed that her garden that  is infested with bugs.  "I just sat down and cried." I use the bilateral stimulation hand paddles with the client as she talked about these things.  She states that her sister is coming up from [place] to help her go through her mom's house this coming weekend.  We discussed some practical things that she could do such as contact her mother's accountant to find out what they will need for taxes.  Also to discuss with him how to manage the credit cards.  I encouraged the client to stay in the present tense and take one item at a time.  To look at the whole  thing will be too overwhelming.  She agreed that she could focus on that.  Interventions: Assertiveness/Communication, Mindfulness Meditation, Motivational Interviewing, Solution-Oriented/Positive Psychology, [employer] Desensitization and Reprocessing (EMDR) and Insight-Oriented  Diagnosis:   ICD-10-CM   1. Depression, major, single episode, mild (HCC)  F32.0     Plan: Mood independent behavior, mindfulness, self-care, contact her mom's account, address the credit cards, meet with her sister at [employer].  Gelene Mink Ilsa Bonello, [facility]

## 2020-02-26 ENCOUNTER — Ambulatory Visit: Payer: 59 | Admitting: Psychiatry

## 2020-03-04 ENCOUNTER — Other Ambulatory Visit: Payer: Self-pay | Admitting: Physician Assistant

## 2020-03-04 NOTE — Telephone Encounter (Signed)
review 

## 2020-03-11 ENCOUNTER — Ambulatory Visit (INDEPENDENT_AMBULATORY_CARE_PROVIDER_SITE_OTHER): Payer: 59 | Admitting: Psychiatry

## 2020-03-11 ENCOUNTER — Encounter: Payer: Self-pay | Admitting: Psychiatry

## 2020-03-11 ENCOUNTER — Other Ambulatory Visit: Payer: Self-pay

## 2020-03-11 DIAGNOSIS — F320 Major depressive disorder, single episode, mild: Secondary | ICD-10-CM

## 2020-03-11 NOTE — Progress Notes (Signed)
      [facility] Counselor/Therapist Progress Note  Patient ID: Sherry Ewing, MRN: [id number],    Date: 03/11/2020  Time Spent: 50 minutes   Treatment Type: Individual Therapy  Reported Symptoms: sadness, anger  Mental Status Exam:  Appearance:   Casual     Behavior:  Appropriate  Motor:  Normal  Speech/Language:   Clear and Coherent  Affect:  Tearful  Mood:  angry and sad  Thought process:  normal  Thought content:    WNL  Sensory/Perceptual disturbances:    WNL  Orientation:  oriented to person, place, time/date and situation  Attention:  Good  Concentration:  Good  Memory:  WNL  Fund of knowledge:   Good  Insight:    Good  Judgment:   Good  Impulse Control:  Good   Risk Assessment: Danger to Self:  No Self-injurious Behavior: No Danger to Others: No Duty to Warn:no Physical Aggression / Violence:No  Access to Firearms a concern: No  Gang Involvement:No   Subjective: The client states that her work has been difficult.  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  She recently met with a lawyer and presented her mom's will.  Her mother had marked up the will with notations.  The lawyer states it makes the will invalid.  The client will have to go to court in [place] to get the will thrown out. At this point the client is exceptionally overwhelmed and began to cry.  She had a combination of sadness and anger.  Anger at her mom for leaving things in such a mess.  She states she does not have money for a lawyer or how to even find one.  The house would be her only inheritance but she would have to sell it and split the money with her nephews.  "I do not have anything."  The client does not want to sell because she is so connected to the house and does not want to sell anything in it as well.  At this point the client shut down in the session and wept.  I did use the bilateral stimulation hand paddles with the  client.  She did not report a decline in her subjective units of distress.  I did advise the client that she did not have to do anything today.  She could set things aside and take one thing at a time.  The client agreed.  Interventions: Motivational Interviewing, Solution-Oriented/Positive Psychology, [employer] Desensitization and Reprocessing (EMDR) and Insight-Oriented  Diagnosis:   ICD-10-CM   1. Depression, major, single episode, mild ([place])  F32.0     Plan: 1 thing at a time, mood independent behavior, self-care, positive self talk, boundaries.  Klee Kolek, [facility]

## 2020-03-20 ENCOUNTER — Ambulatory Visit: Payer: 59 | Admitting: Physician Assistant

## 2020-03-25 ENCOUNTER — Ambulatory Visit: Payer: 59 | Admitting: Physician Assistant

## 2020-03-25 ENCOUNTER — Ambulatory Visit: Payer: 59 | Admitting: Psychiatry

## 2020-04-01 ENCOUNTER — Other Ambulatory Visit: Payer: Self-pay | Admitting: Physician Assistant

## 2020-04-01 NOTE — Telephone Encounter (Signed)
Please review

## 2020-04-01 NOTE — Telephone Encounter (Signed)
Please call pt. For appt.

## 2020-04-02 NOTE — Telephone Encounter (Signed)
Done

## 2020-04-04 ENCOUNTER — Encounter: Payer: Self-pay | Admitting: Physician Assistant

## 2020-04-04 ENCOUNTER — Ambulatory Visit (INDEPENDENT_AMBULATORY_CARE_PROVIDER_SITE_OTHER): Payer: 59 | Admitting: Physician Assistant

## 2020-04-04 ENCOUNTER — Other Ambulatory Visit: Payer: Self-pay

## 2020-04-04 DIAGNOSIS — G2581 Restless legs syndrome: Secondary | ICD-10-CM | POA: Diagnosis not present

## 2020-04-04 DIAGNOSIS — F329 Major depressive disorder, single episode, unspecified: Secondary | ICD-10-CM

## 2020-04-04 DIAGNOSIS — F411 Generalized anxiety disorder: Secondary | ICD-10-CM

## 2020-04-04 DIAGNOSIS — F909 Attention-deficit hyperactivity disorder, unspecified type: Secondary | ICD-10-CM | POA: Diagnosis not present

## 2020-04-04 DIAGNOSIS — F4321 Adjustment disorder with depressed mood: Secondary | ICD-10-CM | POA: Diagnosis not present

## 2020-04-04 DIAGNOSIS — F429 Obsessive-compulsive disorder, unspecified: Secondary | ICD-10-CM

## 2020-04-04 DIAGNOSIS — F40240 Claustrophobia: Secondary | ICD-10-CM

## 2020-04-04 DIAGNOSIS — F4310 Post-traumatic stress disorder, unspecified: Secondary | ICD-10-CM

## 2020-04-04 MED ORDER — LISDEXAMFETAMINE DIMESYLATE 40 MG PO CAPS: 40.0000 mg | ORAL_CAPSULE | ORAL | 0 refills | Status: DC

## 2020-04-04 MED ORDER — GABAPENTIN 600 MG PO TABS: ORAL_TABLET | ORAL | 5 refills | Status: DC
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
## 2020-04-08 ENCOUNTER — Ambulatory Visit (INDEPENDENT_AMBULATORY_CARE_PROVIDER_SITE_OTHER): Payer: 59 | Admitting: Pulmonary Disease

## 2020-04-08 ENCOUNTER — Encounter: Payer: Self-pay | Admitting: Pulmonary Disease

## 2020-04-08 ENCOUNTER — Encounter: Payer: Self-pay | Admitting: Psychiatry

## 2020-04-08 ENCOUNTER — Ambulatory Visit (INDEPENDENT_AMBULATORY_CARE_PROVIDER_SITE_OTHER): Payer: 59

## 2020-04-08 ENCOUNTER — Ambulatory Visit (INDEPENDENT_AMBULATORY_CARE_PROVIDER_SITE_OTHER): Payer: 59 | Admitting: Psychiatry

## 2020-04-08 ENCOUNTER — Other Ambulatory Visit: Payer: Self-pay

## 2020-04-08 VITALS — BP 124/78 | HR 88 | Temp 97.9°F | Ht 66.0 in | Wt 259.0 lb

## 2020-04-08 DIAGNOSIS — J4540 Moderate persistent asthma, uncomplicated: Secondary | ICD-10-CM

## 2020-04-08 DIAGNOSIS — F4321 Adjustment disorder with depressed mood: Secondary | ICD-10-CM

## 2020-04-08 DIAGNOSIS — F320 Major depressive disorder, single episode, mild: Secondary | ICD-10-CM | POA: Diagnosis not present

## 2020-04-08 LAB — CBC WITH DIFFERENTIAL/PLATELET
Basophils Absolute: 0 10*3/uL (ref 0.0–0.1)
Basophils Relative: 0.3 % (ref 0.0–3.0)
Eosinophils Absolute: 0.1 10*3/uL (ref 0.0–0.7)
Eosinophils Relative: 1 % (ref 0.0–5.0)
HCT: 41 % (ref 36.0–46.0)
Hemoglobin: 14 g/dL (ref 12.0–15.0)
Lymphocytes Relative: 21.2 % (ref 12.0–46.0)
Lymphs Abs: 2.3 10*3/uL (ref 0.7–4.0)
MCHC: 34.1 g/dL (ref 30.0–36.0)
MCV: 92 fl (ref 78.0–100.0)
Monocytes Absolute: 0.8 10*3/uL (ref 0.1–1.0)
Monocytes Relative: 7.7 % (ref 3.0–12.0)
Neutro Abs: 7.5 10*3/uL (ref 1.4–7.7)
Neutrophils Relative %: 69.8 % (ref 43.0–77.0)
Platelets: 317 10*3/uL (ref 150.0–400.0)
RBC: 4.45 Mil/uL (ref 3.87–5.11)
RDW: 13.2 % (ref 11.5–15.5)
WBC: 10.7 10*3/uL — ABNORMAL HIGH (ref 4.0–10.5)

## 2020-04-08 NOTE — Progress Notes (Signed)
      [facility] Counselor/Therapist Progress Note  Patient ID: Sherry Ewing, MRN: [id number],    Date: 04/08/2020  Time Spent: 45 minutes   Treatment Type: Individual Therapy  Reported Symptoms: anger, sadness  Mental Status Exam:  Appearance:   Casual     Behavior:  Appropriate  Motor:  Normal  Speech/Language:   Clear and Coherent  Affect:  Depressed  Mood:  angry and sad  Thought process:  normal  Thought content:    WNL  Sensory/Perceptual disturbances:    WNL  Orientation:  oriented to person, place, time/date and situation  Attention:  Good  Concentration:  Good  Memory:  WNL  Fund of knowledge:   Good  Insight:    Good  Judgment:   Good  Impulse Control:  Good   Risk Assessment: Danger to Self:  No Self-injurious Behavior: No Danger to Others: No Duty to Warn:no Physical Aggression / Violence:No  Access to Firearms a concern: No  Gang Involvement:No   Subjective: The client continues to struggle with sadness and grief after the death of her mom.  She states that recently her adopted sister told her that she cannot go back to the beach house.  "She just cannot handle it.  I have to go down Friday by myself.  It is just me.  I am so angry and sad." I started the client with the bilateral stimulation hand paddles.  She emoted quite a bit about having to handle everything herself.  I discussed using mindfulness with the client as she heads to the beach.  I explained that just focusing on the present tense and what she needed to get done would help her keep from spiraling into a catastrophic thought process.  We discussed making a list and taking it one step at a time.  The client notes that there is a lot of cleaning that needs to be done.  She will be on her own doing that.  I suggested to the client that she try to use her anger as a motivator.  Her subjective units of distress went from a 7+ to less than 3.  Interventions: Mindfulness Meditation, Motivational  Interviewing, Solution-Oriented/Positive Psychology, [employer] Desensitization and Reprocessing (EMDR) and Insight-Oriented  Diagnosis:   ICD-10-CM   1. Depression, major, single episode, mild (HCC)  F32.0   2. Grief  F43.21     Plan: Mood independent behavior, positive self talk, pragmatism, make a list, 1 step at a time.  Gelene Mink Jalila Goodnough, [facility]

## 2020-04-08 NOTE — Progress Notes (Signed)
[facility]  Chief Complaint  Patient presents with  . Consult    Reports here today for inability to fit for mask at work; denies respiratory problems/symptoms    Constitutional:  BP 124/78   Pulse 88   Temp 97.9 F (36.6 C)   Ht 5\' 6"  (1.676 m)   Wt 259 lb (117.5 kg)   SpO2 99%   BMI 41.80 kg/m   Past Medical History:  ADHD, GERD, Vit D deficiency, Major depression, Migraine headache, Restless leg syndrome  Past Surgical History:  Her  has a past surgical history that includes Wisdom tooth extraction.  Brief Summary:  Sherry Ewing is a 49 y.o. female with asthma and allergic rhinitis.      Subjective:   She is originally from [place] but has lived in [place] for about 20 years.  She works as a [place].  She is required to get clearance to use air-purifying respiratory (APR) mask.  She has history of allergies and asthma.  She is referred to pulmonary medicine to assess whether there are any pulmonary contra-indications to her using an APR mask.  She reports having claustrophobia and has a letter from behavioral health stating that she can't wear an APR mask because of claustrophobia.  She has also been referred to allergist.  She broke her nose several times while growing up.  She was seen by ENT previous and told she has pinholes for sinuses.  She was advised to have surgery, but it wasn't covered by insurance.  She has constant sinus drainage with green phlegm.  She was last on antibiotics and prednisone in Spring of 2021.  She had pneumonia previously.    She has been on breo and this helps.  She has not had allergy testing before.  She denies skin rash, eczema or hives.  She denies food or medication allergies.  Physical Exam:   Appearance - well kempt   ENMT - no sinus tenderness, no oral exudate, no LAN, Mallampati 3 airway, no stridor, deviated nasal septum  Respiratory - equal breath sounds  bilaterally, no wheezing or rales  CV - s1s2 regular rate and rhythm, no murmurs  Ext - no clubbing, no edema  Skin - no rashes  Psych - normal mood and affect   Pulmonary testing:    Social History:  She  reports that she has never smoked. She has never used smokeless tobacco. She reports previous alcohol use. She reports that she does not use drugs.  Family History:  Her family history is significant for brain aneurysm.     Assessment/Plan:   Allergic asthma. - will arrange for chest xray, pulmonary function test, CBC with differential, and IgE - based on results with determine whether there are any pulmonary contra-indications to her using an APR mask - she has referral for an allergist and plans to follow up with this service long term   Time Spent Involved in Patient Care on Day of Examination:  32 minuts  Follow up:  Patient Instructions  Will arrange for chest xray, blood tests, and pulmonary function test and call with results   Medication List:   Allergies as of 04/08/2020      Reactions   Seroquel [quetiapine Fumarate]    Tramadol Hcl Anxiety   REACTION: shaky crawling feelings      Medication List       Accurate as of April 08, 2020  1:56 PM. If you have any questions,  ask your nurse or doctor.        albuterol 1.25 MG/3ML nebulizer solution Commonly known as: ACCUNEB Take 1 ampule by nebulization every 6 (six) hours as needed for wheezing.   ALPRAZolam 0.5 MG tablet Commonly known as: XANAX Take 1 tablet (0.5 mg total) by mouth 2 (two) times daily as needed for anxiety.   Breo Ellipta 100-25 MCG/INH Aepb Generic drug: fluticasone furoate-vilanterol   cetirizine 10 MG tablet Commonly known as: ZYRTEC Take 10 mg by mouth daily.   eletriptan 40 MG tablet Commonly known as: RELPAX   gabapentin 600 MG tablet Commonly known as: NEURONTIN 4 hours before bed.   ibuprofen 800 MG tablet Commonly known as: ADVIL Take 1 tablet (800 mg  total) by mouth every 8 (eight) hours as needed for moderate pain.   lisdexamfetamine 40 MG capsule Commonly known as: VYVANSE Take 1 capsule (40 mg total) by mouth every morning.   norgestrel-ethinyl estradiol 0.3-30 MG-MCG tablet Commonly known as: LO/OVRAL Take 1 tablet by mouth daily.   ondansetron 8 MG disintegrating tablet Commonly known as: ZOFRAN-ODT Take 8 mg by mouth every 8 (eight) hours as needed.   pantoprazole 40 MG tablet Commonly known as: PROTONIX Take 40 mg by mouth daily as needed.   temazepam 15 MG capsule Commonly known as: RESTORIL Take 1-2 capsules (15-30 mg total) by mouth at bedtime as needed for sleep.   vortioxetine HBr 10 MG Tabs tablet Commonly known as: Trintellix Take 1 tablet (10 mg total) by mouth daily.       Signature:  Coralyn Helling, MD [facility] Pager - [phone] 04/08/2020, 1:56 PM

## 2020-04-08 NOTE — Patient Instructions (Signed)
Will arrange for chest xray, blood tests, and pulmonary function test and call with results

## 2020-04-09 LAB — IGE: IgE (Immunoglobulin E), Serum: 9 kU/L (ref <=114)

## 2020-04-10 ENCOUNTER — Telehealth: Payer: Self-pay | Admitting: Pulmonary Disease

## 2020-04-10 NOTE — Telephone Encounter (Signed)
DG Chest 2 View  Result Date: 04/08/2020 CLINICAL DATA:  Chronic cough, asthma EXAM: CHEST - 2 VIEW COMPARISON:  Radiograph 08/25/2010 FINDINGS: Low lung volumes with some streaky atelectatic features. Mild airways thickening may reflect an acute bronchitis or reactive airways exacerbate shin. No consolidation, features of edema, pneumothorax, or effusion. The cardiomediastinal contours are unremarkable. No acute osseous or soft tissue abnormality. IMPRESSION: Mild airways thickening may reflect an acute bronchitis or reactive airways disease. Electronically Signed   By: Kreg Shropshire M.D.   On: 04/08/2020 22:57     Please let her know her chest xray shows mild changes from asthma.

## 2020-04-12 NOTE — Telephone Encounter (Signed)
Patient returned phone call and writer went over xray results per Dr Craige Cotta. Patient expressed understanding. Rescheduled PFT test for Tuesday 04/16/2020 at 9am per patient request. Canceled the later PFT scheduled on 05/02/2020. Patient reminded to bring COVID vaccination card with her to PFT. Nothing further needed at this time.

## 2020-04-12 NOTE — Telephone Encounter (Signed)
Called and left message on voicemail to please return phone call. Contact number provided. 

## 2020-04-22 ENCOUNTER — Ambulatory Visit (INDEPENDENT_AMBULATORY_CARE_PROVIDER_SITE_OTHER): Payer: 59 | Admitting: Psychiatry

## 2020-04-22 ENCOUNTER — Other Ambulatory Visit: Payer: Self-pay

## 2020-04-22 ENCOUNTER — Encounter: Payer: Self-pay | Admitting: Psychiatry

## 2020-04-22 DIAGNOSIS — F320 Major depressive disorder, single episode, mild: Secondary | ICD-10-CM | POA: Diagnosis not present
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
## 2020-04-26 ENCOUNTER — Other Ambulatory Visit: Payer: Self-pay | Admitting: Physician Assistant

## 2020-04-30 NOTE — Telephone Encounter (Signed)
NEXT APT 12/06

## 2020-05-06 ENCOUNTER — Ambulatory Visit: Payer: 59 | Admitting: Psychiatry

## 2020-05-06 ENCOUNTER — Ambulatory Visit: Payer: 59 | Admitting: Pulmonary Disease

## 2020-05-06 NOTE — Progress Notes (Deleted)
@Patient  ID: , female    DOB: Aug 31, 1970, 49 y.o.   MRN: [id number]  No chief complaint on file.   Referring provider: [id number], MD  HPI:  49 year old female initially consulted with our office in October/2021 for allergic rhinitis as well as inability to wear a mask/PAPR at work  PMH: GERD, anxiety, depression Smoker/ Smoking History: Never smoker Maintenance: None Pt of: Dr. November/2021  05/06/2020  - Visit   49 year old female never smoker initially referred to our office in October/2021.  Patient having difficulty with wearing mask per work requirements.  A chest x-ray was ordered, pulmonary function test as well as lab work.  She establish care with Dr. November/2021.  Questionaires / Pulmonary Flowsheets:   ACT:  No flowsheet data found.  MMRC: No flowsheet data found.  Epworth:  No flowsheet data found.  Tests:    04/08/2020-chest x-ray-mild airways thickening may reflect acute bronchitis or reactive airways disease  04/08/2020-IgE-9 04/08/2020-CBC with differential-eosinophils relative 1, eosinophils absolute 0.1 FENO:  No results found for: NITRICOXIDE  PFT: No flowsheet data found.  WALK:  No flowsheet data found.  Imaging: DG Chest 2 View  Result Date: 04/08/2020 CLINICAL DATA:  Chronic cough, asthma EXAM: CHEST - 2 VIEW COMPARISON:  Radiograph 08/25/2010 FINDINGS: Low lung volumes with some streaky atelectatic features. Mild airways thickening may reflect an acute bronchitis or reactive airways exacerbate shin. No consolidation, features of edema, pneumothorax, or effusion. The cardiomediastinal contours are unremarkable. No acute osseous or soft tissue abnormality. IMPRESSION: Mild airways thickening may reflect an acute bronchitis or reactive airways disease. Electronically Signed   By: 10/25/2010 M.D.   On: 04/08/2020 22:57    Lab Results:  CBC    Component Value Date/Time   WBC 10.7 (H) 04/08/2020 1126   RBC 4.45 04/08/2020 1126    HGB 14.0 04/08/2020 1126   HCT 41.0 04/08/2020 1126   PLT 317.0 04/08/2020 1126   MCV 92.0 04/08/2020 1126   MCHC 34.1 04/08/2020 1126   RDW 13.2 04/08/2020 1126   LYMPHSABS 2.3 04/08/2020 1126   MONOABS 0.8 04/08/2020 1126   EOSABS 0.1 04/08/2020 1126   BASOSABS 0.0 04/08/2020 1126    BMET    Component Value Date/Time   NA 140 07/29/2009 2008   K 4.0 07/29/2009 2008   CL 105 07/29/2009 2008   CO2 24 07/29/2009 2008   GLUCOSE 85 07/29/2009 2008   BUN 10 07/29/2009 2008   CREATININE 0.74 07/29/2009 2008   CALCIUM 9.8 07/29/2009 2008    BNP No results found for: BNP  ProBNP No results found for: PROBNP  Specialty Problems      Pulmonary Problems   ALLERGIC RHINITIS    Qualifier: Diagnosis of  By: 07/31/2009 MD, Lafonda Mosses           Allergies  Allergen Reactions  . Seroquel [Quetiapine Fumarate]   . Tramadol Hcl Anxiety    REACTION: shaky crawling feelings    Immunization History  Administered Date(s) Administered  . PFIZER SARS-COV-2 Vaccination 06/28/2019, 07/21/2019  . Td 06/16/2003    Past Medical History:  Diagnosis Date  . ADHD (attention deficit hyperactivity disorder)   . Fractures of the skull   . GERD (gastroesophageal reflux disease)   . Major depression   . Migraine headache   . Nasal bone fractures   . Restless leg syndrome   . Vitamin D deficiency     Tobacco History: Social History   Tobacco Use  Smoking Status Never Smoker  Smokeless Tobacco Never Used   Counseling given: Not Answered   Continue to not smoke  Outpatient Encounter Medications as of 05/06/2020  Medication Sig  . albuterol (ACCUNEB) 1.25 MG/3ML nebulizer solution Take 1 ampule by nebulization every 6 (six) hours as needed for wheezing.  Marland Kitchen ALPRAZolam (XANAX) 0.5 MG tablet TAKE 1 TABLET (0.5 MG TOTAL) BY MOUTH 2 (TWO) TIMES DAILY AS NEEDED FOR ANXIETY.  Marland Kitchen BREO ELLIPTA 100-25 MCG/INH AEPB   . cetirizine (ZYRTEC) 10 MG tablet Take 10 mg by mouth daily.  Marland Kitchen  eletriptan (RELPAX) 40 MG tablet   . gabapentin (NEURONTIN) 600 MG tablet 4 hours before bed.  Marland Kitchen ibuprofen (ADVIL,MOTRIN) 800 MG tablet Take 1 tablet (800 mg total) by mouth every 8 (eight) hours as needed for moderate pain.  Marland Kitchen lisdexamfetamine (VYVANSE) 40 MG capsule Take 1 capsule (40 mg total) by mouth every morning.  . norgestrel-ethinyl estradiol (LO/OVRAL) 0.3-30 MG-MCG per tablet Take 1 tablet by mouth daily.    . ondansetron (ZOFRAN-ODT) 8 MG disintegrating tablet Take 8 mg by mouth every 8 (eight) hours as needed.  . pantoprazole (PROTONIX) 40 MG tablet Take 40 mg by mouth daily as needed.   . temazepam (RESTORIL) 15 MG capsule Take 1-2 capsules (15-30 mg total) by mouth at bedtime as needed for sleep.  Marland Kitchen vortioxetine HBr (TRINTELLIX) 10 MG TABS tablet Take 1 tablet (10 mg total) by mouth daily.   No facility-administered encounter medications on file as of 05/06/2020.     Review of Systems  Review of Systems   Physical Exam  There were no vitals taken for this visit.  Wt Readings from Last 5 Encounters:  04/08/20 259 lb (117.5 kg)  05/19/19 250 lb (113.4 kg)  09/14/14 230 lb (104.3 kg)  07/23/14 220 lb (99.8 kg)  02/07/13 210 lb (95.3 kg)    BMI Readings from Last 5 Encounters:  04/08/20 41.80 kg/m  05/19/19 40.35 kg/m  09/14/14 37.12 kg/m  07/23/14 35.51 kg/m  02/07/13 33.89 kg/m     Physical Exam    Assessment & Plan:   No problem-specific Assessment & Plan notes found for this encounter.    No follow-ups on file.   Coral Ceo, NP 05/06/2020   This appointment required *** minutes of patient care (this includes precharting, chart review, review of results, face-to-face care, etc.).

## 2020-05-20 ENCOUNTER — Encounter: Payer: Self-pay | Admitting: Physician Assistant

## 2020-05-20 ENCOUNTER — Encounter: Payer: Self-pay | Admitting: Psychiatry

## 2020-05-20 ENCOUNTER — Other Ambulatory Visit: Payer: Self-pay

## 2020-05-20 ENCOUNTER — Ambulatory Visit (INDEPENDENT_AMBULATORY_CARE_PROVIDER_SITE_OTHER): Payer: 59 | Admitting: Psychiatry

## 2020-05-20 ENCOUNTER — Ambulatory Visit: Payer: 59 | Admitting: Physician Assistant

## 2020-05-20 DIAGNOSIS — F329 Major depressive disorder, single episode, unspecified: Secondary | ICD-10-CM | POA: Diagnosis not present

## 2020-05-20 DIAGNOSIS — F411 Generalized anxiety disorder: Secondary | ICD-10-CM

## 2020-05-20 DIAGNOSIS — G2581 Restless legs syndrome: Secondary | ICD-10-CM

## 2020-05-20 DIAGNOSIS — F320 Major depressive disorder, single episode, mild: Secondary | ICD-10-CM | POA: Diagnosis not present

## 2020-05-20 DIAGNOSIS — F4321 Adjustment disorder with depressed mood: Secondary | ICD-10-CM | POA: Diagnosis not present

## 2020-05-20 DIAGNOSIS — F4310 Post-traumatic stress disorder, unspecified: Secondary | ICD-10-CM

## 2020-05-20 DIAGNOSIS — F909 Attention-deficit hyperactivity disorder, unspecified type: Secondary | ICD-10-CM

## 2020-05-20 MED ORDER — ALPRAZOLAM 0.5 MG PO TABS: 0.5000 mg | ORAL_TABLET | Freq: Two times a day (BID) | ORAL | 5 refills | Status: DC | PRN

## 2020-05-20 MED ORDER — LISDEXAMFETAMINE DIMESYLATE 40 MG PO CAPS: 40.0000 mg | ORAL_CAPSULE | ORAL | 0 refills | Status: DC

## 2020-05-20 MED ORDER — VORTIOXETINE HBR 10 MG PO TABS: 10.0000 mg | ORAL_TABLET | Freq: Every day | ORAL | 5 refills | Status: DC

## 2020-05-20 MED ORDER — TEMAZEPAM 15 MG PO CAPS: 15.0000 mg | ORAL_CAPSULE | Freq: Every evening | ORAL | 5 refills | Status: DC | PRN

## 2020-05-20 NOTE — Progress Notes (Signed)
      [facility] Counselor/Therapist Progress Note  Patient ID: Sherry Ewing, MRN: [id number],    Date: 05/20/2020  Time Spent: 45 minutes   Treatment Type: Individual Therapy  Reported Symptoms: sadness, irritability, anhedonia, depressed mood  Mental Status Exam:  Appearance:   Casual     Behavior:  Appropriate  Motor:  Normal  Speech/Language:   Clear and Coherent  Affect:  Depressed  Mood:  depressed, irritable and sad  Thought process:  normal  Thought content:    WNL  Sensory/Perceptual disturbances:    WNL  Orientation:  oriented to person, place, time/date and situation  Attention:  Good  Concentration:  Good  Memory:  WNL  Fund of knowledge:   Good  Insight:    Good  Judgment:   Good  Impulse Control:  Good   Risk Assessment: Danger to Self:  No Self-injurious Behavior: No Danger to Others: No Duty to Warn:no Physical Aggression / Violence:No  Access to Firearms a concern: No  Gang Involvement:No   Subjective: The client states that she went to her parents beach house with her cousin.  She had wanted to get the kitchen cleaned at least but her cousin was focused on getting rid of things.  "I could not handle it."  The client still has not found an attorney to manage her parents estate.  She has not made much progress in getting things cleaned out which weighs heavily on her. 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  This is someone she states that is coming out of the training office that she has not had the best relationship with.  She is also upset that she does not get to start off fresh with someone but her whole history follows her.  Last year she was written up for violation of procedure that the client disagreed was a violation.  I used the bilateral stimulation hand paddles with the client as she processed her irritability and sadness connected to these events.  She is not sure what the future holds for her with her job.  She feels  isolated and alone.  Her subjective units of distress started at 7 and ended at less than 4.  The client is working on her social network.  She is working as many hours that she can to stabilize her finances.  She is hopeful that she can maintain her job and will not be written up for any potential problems.  I encouraged the client to meet with her new captain and communicate to him a willingness to work with him.  Interventions: Assertiveness/Communication, Motivational Interviewing, Solution-Oriented/Positive Psychology, [employer] Desensitization and Reprocessing (EMDR) and Insight-Oriented  Diagnosis:   ICD-10-CM   1. Depression, major, single episode, mild (HCC)  F32.0     Plan: Meet with the new captain, mood independent behavior, positive self talk, radical acceptance, assertiveness, boundaries.  Gelene Mink Suleyman Ehrman, [facility]

## 2020-05-20 NOTE — Progress Notes (Signed)
[facility] Med Check  Patient ID: Sherry Ewing,  MRN: [id number]  PCP: Sherry Montana, MD  Date of Evaluation: 05/20/2020 Time spent:20 minutes  Chief Complaint:  Chief Complaint    Depression; Anxiety; Insomnia; Follow-up      HISTORY/CURRENT STATUS:  She is having times that she does not enjoy things and gets sad when thinking about her mom who died earlier this year.  "I have my moments but overall I am doing pretty good."  Energy and motivation are fair to good most of the time and are definitely helped when she takes the Vyvanse on nights that she works.  She is not isolating.  She does cry when thinking about her mom but states it is manageable.  Appetite is normal. RLS is a lot better.  She sleeps well most of the time.  She does have to take temazepam.  She does shift work and has a hard time falling asleep otherwise.  Denies suicidal or homicidal thoughts.  Anxiety is still an issue.  The Xanax does help though.  She is also using coping techniques that help when she starts feeling panicky.  She takes the Vyvanse only as needed, which is usually the night she works. Only takes it 2-3 times per week.  It works well to help her be able to focus and finish things in a timely manner.  Patient denies increased energy with decreased need for sleep, no increased talkativeness, no racing thoughts, no impulsivity or risky behaviors, no increased spending, no increased libido, no grandiosity, no increased irritability or anger, and no hallucinations.  Denies dizziness, syncope, seizures, numbness, tingling, tremor, tics, unsteady gait, slurred speech, confusion. Denies muscle or joint pain, stiffness, or dystonia.  Individual Medical History/ Review of Systems: Changes? :No    Past medications for mental health diagnoses include: Paxil, Buspar, Ambien, Trazodone, ropinirole possibly cause nausea/vomiting  Allergies: Seroquel [quetiapine fumarate] and Tramadol hcl  Current  Medications:  Current Outpatient Medications:  .  albuterol (ACCUNEB) 1.25 MG/3ML nebulizer solution, Take 1 ampule by nebulization every 6 (six) hours as needed for wheezing., Disp: , Rfl:  .  ALPRAZolam (XANAX) 0.5 MG tablet, Take 1 tablet (0.5 mg total) by mouth 2 (two) times daily as needed for anxiety., Disp: 40 tablet, Rfl: 5 .  BREO ELLIPTA 100-25 MCG/INH AEPB, , Disp: , Rfl:  .  cetirizine (ZYRTEC) 10 MG tablet, Take 10 mg by mouth daily., Disp: , Rfl:  .  eletriptan (RELPAX) 40 MG tablet, , Disp: , Rfl:  .  gabapentin (NEURONTIN) 600 MG tablet, 4 hours before bed., Disp: 30 tablet, Rfl: 5 .  ibuprofen (ADVIL,MOTRIN) 800 MG tablet, Take 1 tablet (800 mg total) by mouth every 8 (eight) hours as needed for moderate pain., Disp: 21 tablet, Rfl: 0 .  lisdexamfetamine (VYVANSE) 40 MG capsule, Take 1 capsule (40 mg total) by mouth every morning., Disp: 30 capsule, Rfl: 0 .  ondansetron (ZOFRAN-ODT) 8 MG disintegrating tablet, Take 8 mg by mouth every 8 (eight) hours as needed., Disp: , Rfl:  .  pantoprazole (PROTONIX) 40 MG tablet, Take 40 mg by mouth daily as needed. , Disp: , Rfl:  .  temazepam (RESTORIL) 15 MG capsule, Take 1-2 capsules (15-30 mg total) by mouth at bedtime as needed for sleep., Disp: 60 capsule, Rfl: 5 .  vortioxetine HBr (TRINTELLIX) 10 MG TABS tablet, Take 1 tablet (10 mg total) by mouth daily., Disp: 30 tablet, Rfl: 5 .  norgestrel-ethinyl estradiol (LO/OVRAL) 0.3-30 MG-MCG per tablet, Take  1 tablet by mouth daily.   (Patient not taking: Reported on 05/20/2020), Disp: , Rfl:  Medication Side Effects: none  Family Medical/ Social History: Changes?  No  MENTAL HEALTH EXAM:  There were no vitals taken for this visit.There is no height or weight on file to calculate BMI.  General Appearance: Casual, Neat, Well Groomed and Obese  Eye Contact:  Good  Speech:  Clear and Coherent and Normal Rate  Volume:  Normal  Mood:  Sad  Affect:  A little tearful when talking about the  holidays and not having her mom here  Thought Process:  Goal Directed and Descriptions of Associations: Intact  Orientation:  Full (Time, Place, and Person)  Thought Content: Logical   Suicidal Thoughts:  No  Homicidal Thoughts:  No  Memory:  WNL  Judgement:  Good  Insight:  Good  Psychomotor Activity:  Normal  Concentration:  Concentration: Good and Attention Span: Good  Recall:  Good  Fund of Knowledge: Good  Language: Good  Assets:  Desire for Improvement  ADL's:  Intact  Cognition: WNL  Prognosis:  Good    DIAGNOSES:    ICD-10-CM   1. Reactive depression  F32.9   2. Grief  F43.21   3. Restless leg syndrome  G25.81   4. PTSD (post-traumatic stress disorder)  F43.10   5. Attention deficit hyperactivity disorder (ADHD), unspecified ADHD type  F90.9   6. Generalized anxiety disorder  F41.1     Receiving Psychotherapy: Yes With Sherry Ewing, [facility]   RECOMMENDATIONS:  PDMP was reviewed. I provided 20 minutes of face-to-face time with her. I am glad that she is doing pretty well.  Due to her grief, I am sure the holidays will be hard but she seems to be doing well with the medications and therapy.  She is using a lot of coping techniques that she has learned there. We again discussed the Vyvanse and Xanax.  She does not take those together and only Vyvanse when she has to work which is few days a week. Continue gabapentin 600 mg, 1 p.o. around 4 hours before bed.  Continue Xanax 0.5 mg 1 p.o. twice daily as needed.  Do not take with Vyvanse. Continue Vyvanse 40 mg every morning as needed.  Do not take with Xanax. Continue temazepam 15 mg, 1-2 nightly as needed.  (She works night shift.) Continue Trintellix 10 mg daily.  Continue therapy with Sherry Ewing, [facility]. Return in 2 months.  Sherry Overly, PA-C

## 2020-06-18 ENCOUNTER — Other Ambulatory Visit (HOSPITAL_COMMUNITY): Payer: Self-pay

## 2020-06-26 ENCOUNTER — Encounter: Payer: Self-pay | Admitting: *Deleted

## 2020-06-26 ENCOUNTER — Ambulatory Visit: Payer: 59 | Admitting: Pulmonary Disease

## 2020-06-26 ENCOUNTER — Telehealth: Payer: Self-pay | Admitting: Pulmonary Disease

## 2020-06-26 NOTE — Telephone Encounter (Signed)
Okay to send work note stating she is okay to use N95 and/or surgical mask.

## 2020-06-26 NOTE — Telephone Encounter (Signed)
Letter generated and sent to patient's MyChart ATC patient unable to reach left message on voicemail stating the letter was sent to her mychart account.  Nothing further needed at this time.

## 2020-06-26 NOTE — Telephone Encounter (Signed)
06/26/2020  Patient has been working with Dr. 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  Unfortunately the PFT has not yet been completed.  This work-up has been complicated again as patient has tested positive for SARS-CoV-2 on 06/14/2020.  Patient is now requesting a work note from Dr. 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  If Dr. Craige Cotta is agreeable to this and we can generate a work note and this can be sent to the patient stating this in my chart.  Patient plans to contact her office back to reschedule her follow-up with Dr. Craige Cotta as his schedule is not currently ill and he has no current openings.  Dr. Craige Cotta you okay with this?  Elisha Headland, FNP

## 2020-07-01 ENCOUNTER — Ambulatory Visit: Payer: 59 | Admitting: Psychiatry

## 2020-07-24 ENCOUNTER — Other Ambulatory Visit: Payer: Self-pay

## 2020-07-24 ENCOUNTER — Ambulatory Visit (INDEPENDENT_AMBULATORY_CARE_PROVIDER_SITE_OTHER): Payer: 59 | Admitting: Psychiatry

## 2020-07-24 ENCOUNTER — Encounter: Payer: Self-pay | Admitting: Psychiatry

## 2020-07-24 DIAGNOSIS — F320 Major depressive disorder, single episode, mild: Secondary | ICD-10-CM

## 2020-07-24 NOTE — Progress Notes (Signed)
      [facility] Counselor/Therapist Progress Note  Patient ID: Sherry Ewing, MRN: [id number],    Date: 07/24/2020  Time Spent: 45 minutes   Treatment Type: Individual Therapy  Reported Symptoms: sadness, depressed, grief  Mental Status Exam:  Appearance:   Casual     Behavior:  Appropriate  Motor:  Normal  Speech/Language:   Clear and Coherent  Affect:  Depressed  Mood:  depressed, sad and grief  Thought process:  normal  Thought content:    WNL  Sensory/Perceptual disturbances:    WNL  Orientation:  oriented to person, place, time/date and situation  Attention:  Good  Concentration:  Good  Memory:  WNL  Fund of knowledge:   Good  Insight:    Good  Judgment:   Good  Impulse Control:  Good   Risk Assessment: Danger to Self:  No Self-injurious Behavior: No Danger to Others: No Duty to Warn:no Physical Aggression / Violence:No  Access to Firearms a concern: No  Gang Involvement:No   Subjective: The client is very sad today.  She is still grieving the loss of her mom.  She finally received a report from the medical examiner's office.  She thought her mom had died of suicide, which was her suspicion.  The cause of death was COVID 19.  This shocked the client.  She is upset with herself that she did not put the symptoms together when she talked with her mom.  Her mom had told her she did not feel well but did not describe any symptoms consistent with COVID-19.  I discussed with the client as she could not blame herself.  The circumstances were difficult and her mom was not very forthcoming with how well she was doing.  The client had just thought her mom was depressed.  We discussed that there was only so much she could do since [place] was [place] the beach.  The client herself got COVID-19 over the New Year's holiday.  She had traveled to [place] to see her adopted sister and most likely picked it up when she went to [employer]..  During that time they were able to engage a  lawyers services to settle her mom's estate Today we used the bilateral stimulation hand paddles.  The client was very tearful and hard to understand.  She feels alone and abandoned since all her immediate family has died.  Her father, her mother and her brother.  I discussed with the client the need to reach out and develop a new social network.  She states she is just not in the place to do that right now.  I also discussed with her some of her engaged activities now that we are heading towards..  The client likes to grow vegetables which gives her level of satisfaction.  The client's subjective unit's of distress did not change much from 8 to a 6.  I recommended again that the client Randel Hargens want to follow-up with hospice services for grief counseling.  The client is unsure if she will do this.  Interventions: Motivational Interviewing, Solution-Oriented/Positive Psychology, [employer] Desensitization and Reprocessing (EMDR) and Insight-Oriented  Diagnosis:   ICD-10-CM   1. Depression, major, single episode, mild (HCC)  F32.0     Plan: Positive self talk, self-care, social network, engaged activities, grief counseling and hospice.  Gelene Mink Ailsa Mireles, [facility]

## 2020-07-29 ENCOUNTER — Ambulatory Visit: Payer: 59 | Admitting: Physician Assistant

## 2020-07-29 ENCOUNTER — Ambulatory Visit (INDEPENDENT_AMBULATORY_CARE_PROVIDER_SITE_OTHER): Payer: 59 | Admitting: Psychiatry

## 2020-07-29 ENCOUNTER — Encounter: Payer: Self-pay | Admitting: Psychiatry

## 2020-07-29 ENCOUNTER — Other Ambulatory Visit: Payer: Self-pay

## 2020-07-29 ENCOUNTER — Encounter: Payer: Self-pay | Admitting: Physician Assistant

## 2020-07-29 DIAGNOSIS — F329 Major depressive disorder, single episode, unspecified: Secondary | ICD-10-CM

## 2020-07-29 DIAGNOSIS — G2581 Restless legs syndrome: Secondary | ICD-10-CM | POA: Diagnosis not present

## 2020-07-29 DIAGNOSIS — F909 Attention-deficit hyperactivity disorder, unspecified type: Secondary | ICD-10-CM | POA: Diagnosis not present

## 2020-07-29 DIAGNOSIS — F320 Major depressive disorder, single episode, mild: Secondary | ICD-10-CM

## 2020-07-29 DIAGNOSIS — F4321 Adjustment disorder with depressed mood: Secondary | ICD-10-CM

## 2020-07-29 DIAGNOSIS — F411 Generalized anxiety disorder: Secondary | ICD-10-CM

## 2020-07-29 MED ORDER — LISDEXAMFETAMINE DIMESYLATE 40 MG PO CAPS: 40.0000 mg | ORAL_CAPSULE | ORAL | 0 refills | Status: DC

## 2020-07-29 NOTE — Progress Notes (Signed)
      [facility] Counselor/Therapist Progress Note  Patient ID: Sherry Ewing, MRN: [id number],    Date: 07/29/2020  Time Spent: 45 minutes   Treatment Type: Individual Therapy  Reported Symptoms: sad, fatigued  Mental Status Exam:  Appearance:   Casual     Behavior:  Appropriate  Motor:  Normal  Speech/Language:   Clear and Coherent  Affect:  Appropriate  Mood:  depressed and sad  Thought process:  normal  Thought content:    WNL  Sensory/Perceptual disturbances:    WNL  Orientation:  oriented to person, place, time/date and situation  Attention:  Good  Concentration:  Good  Memory:  WNL  Fund of knowledge:   Good  Insight:    Good  Judgment:   Good  Impulse Control:  Good   Risk Assessment: Danger to Self:  No Self-injurious Behavior: No Danger to Others: No Duty to Warn:no Physical Aggression / Violence:No  Access to Firearms a concern: No  Gang Involvement:No   Subjective: The client is exceptionally tired today.  She states that working the night shift has been hard on her sleep.  She finds herself concerned about all the different things that she needs to do which keeps her awake.  She has been taking temazepam for her sleep which she finds hit and miss.  Today I discussed with the client of using the EMDR audio file, "Ocean metronome" as a tool to help her with sleep.  I have emailed the file to the client.  She can downloaded onto her phone and then listen to it as she is going to sleep.  It has a bilateral tone which tends to calm and relax.  The client states that she will use this.  We also discussed sleep hygiene and the necessity to be consistent with that. The client also continues to slowly move forward on dealing with her mom's estate.  She tends to avoid it because of the emotional content connected to.  Today I used the bilateral stimulation hand paddles with the client that she discussed these issues.  She did not cry today but her level of disturbance  did not change.  It stayed at a 4+.  Interventions: Assertiveness/Communication, Motivational Interviewing, Solution-Oriented/Positive Psychology, [employer] Desensitization and Reprocessing (EMDR) and Insight-Oriented  Diagnosis:   ICD-10-CM   1. Depression, major, single episode, mild (HCC)  F32.0     Plan: Use EMDR audio file, mood independent behavior, self-care, sleep hygiene, exercise, assertiveness, boundaries.  Gelene Mink Shantara Goosby, [facility]

## 2020-07-29 NOTE — Progress Notes (Signed)
[facility] Med Check  Patient ID: Sherry Ewing,  MRN: [id number]  PCP: Laurann Montana, MD  Date of Evaluation: 07/29/2020 Time spent:20 minutes  Chief Complaint:  Chief Complaint    Anxiety; Depression; Insomnia; Follow-up      HISTORY/CURRENT STATUS: For 2 month med check.   Had a rough holiday season b/c of the losses she's had. Birthday was not good either. Just really sad. Then she got COVID at the end of the year. States she just has to work through it. She is working without missing days. She still works night shift and has trouble sleeping after she gets home. The temazepam helps. Restlessness in her legs is well treated with the gabapentin. The Xanax does help with anxiety when needed. She is not usually having panic attacks, more a sense of unease.  Able to enjoy things when she can. She is very tired though when she gets a day off and sleeps a lot to catch up on rest. Energy and motivation are at her baseline. Does cry easily sometimes. Denies suicidal or homicidal thoughts.  She takes the Vyvanse only as needed, which is usually the night she works. Only takes it 2-3 times per week.  It works well to help her be able to focus and finish things in a timely manner.  Patient denies increased energy with decreased need for sleep, no increased talkativeness, no racing thoughts, no impulsivity or risky behaviors, no increased spending, no increased libido, no grandiosity, no increased irritability or anger, and no hallucinations.  Denies dizziness, syncope, seizures, numbness, tingling, tremor, tics, unsteady gait, slurred speech, confusion. Denies muscle or joint pain, stiffness, or dystonia.  Individual Medical History/ Review of Systems: Changes? :Yes Had COVID the end of last year.    Past medications for mental health diagnoses include: Paxil, Buspar, Ambien, Trazodone, ropinirole possibly cause nausea/vomiting  Allergies: Requip [ropinirole hcl], Seroquel  [quetiapine fumarate], and Tramadol hcl  Current Medications:  Current Outpatient Medications:  .  albuterol (ACCUNEB) 1.25 MG/3ML nebulizer solution, Take 1 ampule by nebulization every 6 (six) hours as needed for wheezing., Disp: , Rfl:  .  ALPRAZolam (XANAX) 0.5 MG tablet, Take 1 tablet (0.5 mg total) by mouth 2 (two) times daily as needed for anxiety., Disp: 40 tablet, Rfl: 5 .  azelastine (ASTELIN) 0.1 % nasal spray, SMARTSIG:1-2 Spray(s) Both Nares Twice Daily, Disp: , Rfl:  .  BREO ELLIPTA 100-25 MCG/INH AEPB, , Disp: , Rfl:  .  cetirizine (ZYRTEC) 10 MG tablet, Take 10 mg by mouth daily., Disp: , Rfl:  .  eletriptan (RELPAX) 40 MG tablet, , Disp: , Rfl:  .  gabapentin (NEURONTIN) 600 MG tablet, 4 hours before bed., Disp: 30 tablet, Rfl: 5 .  ondansetron (ZOFRAN-ODT) 8 MG disintegrating tablet, Take 8 mg by mouth every 8 (eight) hours as needed., Disp: , Rfl:  .  pantoprazole (PROTONIX) 40 MG tablet, Take 40 mg by mouth daily as needed. , Disp: , Rfl:  .  temazepam (RESTORIL) 15 MG capsule, Take 1-2 capsules (15-30 mg total) by mouth at bedtime as needed for sleep., Disp: 60 capsule, Rfl: 5 .  vortioxetine HBr (TRINTELLIX) 10 MG TABS tablet, Take 1 tablet (10 mg total) by mouth daily., Disp: 30 tablet, Rfl: 5 .  ibuprofen (ADVIL,MOTRIN) 800 MG tablet, Take 1 tablet (800 mg total) by mouth every 8 (eight) hours as needed for moderate pain. (Patient not taking: Reported on 07/29/2020), Disp: 21 tablet, Rfl: 0 .  lisdexamfetamine (VYVANSE) 40 MG capsule, Take  1 capsule (40 mg total) by mouth every morning., Disp: 30 capsule, Rfl: 0 .  norgestrel-ethinyl estradiol (LO/OVRAL) 0.3-30 MG-MCG per tablet, Take 1 tablet by mouth daily.   (Patient not taking: Reported on 05/20/2020), Disp: , Rfl:  Medication Side Effects: none  Family Medical/ Social History: Changes?  No  MENTAL HEALTH EXAM:  There were no vitals taken for this visit.There is no height or weight on file to calculate BMI.  General  Appearance: Casual, Neat, Well Groomed and Obese  Eye Contact:  Good  Speech:  Clear and Coherent and Normal Rate  Volume:  Normal  Mood:  A little sad but also smiles and laughs at times.  Affect:  A little tearful when talking about the holidays and not having her mom here  Thought Process:  Goal Directed and Descriptions of Associations: Intact  Orientation:  Full (Time, Place, and Person)  Thought Content: Logical   Suicidal Thoughts:  No  Homicidal Thoughts:  No  Memory:  WNL  Judgement:  Good  Insight:  Good  Psychomotor Activity:  Normal  Concentration:  Concentration: Good and Attention Span: Good  Recall:  Good  Fund of Knowledge: Good  Language: Good  Assets:  Desire for Improvement  ADL's:  Intact  Cognition: WNL  Prognosis:  Good    DIAGNOSES:    ICD-10-CM   1. Reactive depression  F32.9   2. Restless leg syndrome  G25.81   3. Grief  F43.21   4. Attention deficit hyperactivity disorder (ADHD), unspecified ADHD type  F90.9   5. Generalized anxiety disorder  F41.1     Receiving Psychotherapy: Yes With [name], [facility]   RECOMMENDATIONS:  PDMP was reviewed. I provided 20 minutes of face-to-face time during this encounter discussing the depression and need to increase Trintellix. She really prefers not to because she does not have side effects at this dose. She will let me know if her mood worsens though and we can increase the dose by phone. Continue gabapentin 600 mg, 1 p.o. around 4 hours before bed.  Continue Xanax 0.5 mg 1 p.o. twice daily as needed.  Do not take with Vyvanse. Continue Vyvanse 40 mg every morning as needed.  Do not take with Xanax. Continue temazepam 15 mg, 1-2 nightly as needed.  (She works night shift.) Continue Trintellix 10 mg daily.  Continue therapy with [name], [facility]. Return in 4 months.  [name], PA-C

## 2020-08-12 ENCOUNTER — Ambulatory Visit (INDEPENDENT_AMBULATORY_CARE_PROVIDER_SITE_OTHER): Payer: 59 | Admitting: Psychiatry

## 2020-08-12 ENCOUNTER — Other Ambulatory Visit: Payer: Self-pay

## 2020-08-12 ENCOUNTER — Encounter: Payer: Self-pay | Admitting: Psychiatry

## 2020-08-12 DIAGNOSIS — F320 Major depressive disorder, single episode, mild: Secondary | ICD-10-CM

## 2020-08-12 NOTE — Progress Notes (Signed)
      [facility] Counselor/Therapist Progress Note  Patient ID: Sherry Ewing, MRN: [id number],    Date: 08/12/2020  Time Spent: 50 minutes   Treatment Type: Individual Therapy  Reported Symptoms: sad, lack of motivation, poor sleep  Mental Status Exam:  Appearance:   Casual     Behavior:  Appropriate  Motor:  Normal  Speech/Language:   Clear and Coherent  Affect:  Depressed  Mood:  depressed and sad  Thought process:  normal  Thought content:    WNL  Sensory/Perceptual disturbances:    WNL  Orientation:  oriented to person, place, time/date and situation  Attention:  Good  Concentration:  Good  Memory:  WNL  Fund of knowledge:   Good  Insight:    Good  Judgment:   Good  Impulse Control:  Good   Risk Assessment: Danger to Self:  No Self-injurious Behavior: No Danger to Others: No Duty to Warn:no Physical Aggression / Violence:No  Access to Firearms a concern: No  Gang Involvement:No   Subjective: The client has been trying to do more self-care by setting up her garden and planting seeds in preparation for spring.  She has been working with a Clinical research associate to begin to settle her mom's estate.  That has been difficult for her to get the motivation to accomplish what the lawyer has asked for. The client states that her sleep has been off due to changes in her schedule and things on her mind.  She has tried listening to the [place] metronome, EMDR audio file, to help with sleep but has not found it helpful.  The client does have Xanax that is prescribed for sleep which she does not necessarily like to take.  She is concerned about memory problems.  She states currently her memory has been off.  I pointed out to the client that she has been under a lot of stress with poor sleep.  That affects one's memory.  I suggested that she try the supplement NAC along with a good B vitamin with methyl folate.  That has been shown to help with memory problems. The client use the bilateral  stimulation hand paddles to help reduce her agitation related to the settling of her parents estate.  She is worried about how the assets will be split up between her and her 4 nephews.  She feels off balance because of that.  I pointed out to the client that she needed to stay in the present tense and not get ahead of herself.  The client agreed and will work on that.  Her subjective units of distress went from a 5 to less than 3.  Interventions: Assertiveness/Communication, Mindfulness Meditation, Motivational Interviewing, Solution-Oriented/Positive Psychology, [employer] Desensitization and Reprocessing (EMDR) and Insight-Oriented  Diagnosis:   ICD-10-CM   1. Depression, major, single episode, mild (HCC)  F32.0     Plan: Mindfulness, sleep hygiene, self-care, NAC and B vitamin with methyl folate, mild exercise, positive self talk, assertiveness, boundaries, mood independent behavior.  Sherry Ewing, [facility]

## 2020-08-26 ENCOUNTER — Ambulatory Visit (INDEPENDENT_AMBULATORY_CARE_PROVIDER_SITE_OTHER): Payer: 59 | Admitting: Psychiatry

## 2020-08-26 ENCOUNTER — Other Ambulatory Visit: Payer: Self-pay

## 2020-08-26 ENCOUNTER — Encounter: Payer: Self-pay | Admitting: Psychiatry

## 2020-08-26 DIAGNOSIS — F320 Major depressive disorder, single episode, mild: Secondary | ICD-10-CM

## 2020-08-26 NOTE — Progress Notes (Signed)
      [facility] Counselor/Therapist Progress Note  Patient ID: Sherry Ewing, MRN: [id number],    Date: 08/26/2020  Time Spent: 50 minutes   Treatment Type: Individual Therapy  Reported Symptoms: sad, tearful, depressed  Mental Status Exam:  Appearance:   Casual and Well Groomed     Behavior:  Appropriate  Motor:  Normal  Speech/Language:   Clear and Coherent  Affect:  Tearful  Mood:  depressed and sad  Thought process:  normal  Thought content:    WNL  Sensory/Perceptual disturbances:    WNL  Orientation:  oriented to person, place, time/date and situation  Attention:  Good  Concentration:  Good  Memory:  WNL  Fund of knowledge:   Good  Insight:    Good  Judgment:   Good  Impulse Control:  Good   Risk Assessment: Danger to Self:  No Self-injurious Behavior: No Danger to Others: No Duty to Warn:no Physical Aggression / Violence:No  Access to Firearms a concern: No  Gang Involvement:No   Subjective: The client states that her adoptive sister wants her to move down to [place], [place].  The client does feel like she needs to change but not going further [place].  She states today that she is tired of feeling so down.  "The few friends I have are busy."  The client admits that she needs to change but does not know what to do.  We started with eye-movement as the client thought about this.  She became very sad and tearful.  She began to think about having to settle her mom's estate and how her mom died at such and an opportune time for the client.  She misses her mom and dad.  A lawyer is handling the estate but the client is still having to manage the household care.  I discussed with the client [place] that be helpful for her even though [place] beach?  She was not sure if that would be manageable.  She also does not know what she can or cannot do concerning the property.  I encouraged the client to contact the  lawyer to see if something could be done.  We also discussed the client increasing her self-care.  I asked her to try to start walking again and setting up her raised beds for the spring.  She agreed she could work on this.  Her subjective units of distress went from a 5+ to less than 3.  Interventions: Motivational Interviewing, Solution-Oriented/Positive Psychology, [employer] Desensitization and Reprocessing (EMDR) and Insight-Oriented  Diagnosis:   ICD-10-CM   1. Depression, major, single episode, mild (HCC)  F32.0     Plan: Increase self-care, contact estate lawyer, start working on raised beds, walking, mood independent behavior, positive self talk.  Sherry Ewing, [facility]

## 2020-09-09 ENCOUNTER — Ambulatory Visit: Payer: 59 | Admitting: Physician Assistant

## 2020-09-09 ENCOUNTER — Ambulatory Visit: Payer: 59 | Admitting: Psychiatry

## 2020-09-10 ENCOUNTER — Telehealth: Payer: Self-pay | Admitting: Physician Assistant

## 2020-09-10 NOTE — Telephone Encounter (Signed)
Pt called requesting increase on Trintellix to 15 mg. Currently taking 10 mg. Pt was a little upset when she called. If agree to increase, will need to send Rx for 5 mg to [facility] [address]. Seen 2/14. Next apt 6/20. Contact # [phone]

## 2020-09-10 NOTE — Telephone Encounter (Signed)
Please review

## 2020-09-11 ENCOUNTER — Other Ambulatory Visit: Payer: Self-pay | Admitting: Physician Assistant

## 2020-09-11 MED ORDER — VORTIOXETINE HBR 5 MG PO TABS: 5.0000 mg | ORAL_TABLET | Freq: Every day | ORAL | 2 refills | Status: DC

## 2020-09-11 NOTE — Telephone Encounter (Signed)
Left detailed msg with information

## 2020-09-11 NOTE — Telephone Encounter (Signed)
He has increased to a total of 15 mg.  Prescription for the 5 mg was sent to the above pharmacy, 2 refills were given.  Have her move up her appointment with me to early June if at all possible.

## 2020-09-11 NOTE — Progress Notes (Signed)
trintelli

## 2020-10-07 ENCOUNTER — Ambulatory Visit (INDEPENDENT_AMBULATORY_CARE_PROVIDER_SITE_OTHER): Payer: 59 | Admitting: Psychiatry

## 2020-10-07 ENCOUNTER — Other Ambulatory Visit: Payer: Self-pay

## 2020-10-07 ENCOUNTER — Encounter: Payer: Self-pay | Admitting: Psychiatry

## 2020-10-07 DIAGNOSIS — F320 Major depressive disorder, single episode, mild: Secondary | ICD-10-CM | POA: Diagnosis not present

## 2020-10-07 NOTE — Progress Notes (Signed)
      [facility] Counselor/Therapist Progress Note  Patient ID: Sherry Ewing, MRN: [id number],    Date: 10/07/2020  Time Spent: 45 minutes   Treatment Type: Individual Therapy  Reported Symptoms: sad  Mental Status Exam:  Appearance:   Casual     Behavior:  Appropriate  Motor:  Normal  Speech/Language:   Clear and Coherent  Affect:  Appropriate  Mood:  sad  Thought process:  normal  Thought content:    WNL  Sensory/Perceptual disturbances:    WNL  Orientation:  oriented to person, place, time/date and situation  Attention:  Good  Concentration:  Good  Memory:  WNL  Fund of knowledge:   Good  Insight:    Good  Judgment:   Good  Impulse Control:  Good   Risk Assessment: Danger to Self:  No Self-injurious Behavior: No Danger to Others: No Duty to Warn:no Physical Aggression / Violence:No  Access to Firearms a concern: No  Gang Involvement:No   Subjective: The client states that she went to the beach with her sister-in-law and nephews.  She stated that she had not talked to them about her mother's house yet.  "I just wanted to know what they think."  The client has finally signed the papers with the lawyer to begin settling the estate.  She is in the process of being named as the executor of the estate.  They found her mother's will with hand notations giving the client's cousin everything.  This will was not notarized so apparently is not legal. The client is very sad about all of this.  She believes that her sister-in-law will be reasonable as she settles the estate.  Today we discussed the fact that I would be retiring by the end of the week.  The client has agreed to follow-up with Tina Griffiths, [facility].  She has been having a harder time with her job many times just wanting to quit.  We discussed the things that she needs to do as part of her self-care which includes gardening and cooking.  Now that the weather is nicer the client will also try to walk more.  The client used  the bilateral stimulation hand paddles as she discussed these issues.  She was able to reduce her subjective units of distress from a 6 to less than 4.  Services ended by mutual consent.  Interventions: Assertiveness/Communication, Motivational Interviewing, Solution-Oriented/Positive Psychology, [employer] Desensitization and Reprocessing (EMDR) and Insight-Oriented  Diagnosis:   ICD-10-CM   1. Depression, major, single episode, mild (HCC)  F32.0     Plan: Gardening, cooking, walking, other self-care, positive self talk, assertiveness, boundaries.  Gelene Mink Tailynn Armetta, [facility]

## 2020-10-14 ENCOUNTER — Ambulatory Visit: Payer: 59 | Admitting: Psychiatry

## 2020-11-04 ENCOUNTER — Ambulatory Visit: Payer: 59 | Admitting: Psychiatry

## 2020-11-18 ENCOUNTER — Other Ambulatory Visit: Payer: Self-pay | Admitting: Physician Assistant

## 2020-11-18 ENCOUNTER — Ambulatory Visit: Payer: 59 | Admitting: Psychiatry

## 2020-11-18 NOTE — Telephone Encounter (Signed)
Last filled 10/18/20

## 2020-12-01 ENCOUNTER — Other Ambulatory Visit: Payer: Self-pay | Admitting: Physician Assistant

## 2020-12-02 ENCOUNTER — Ambulatory Visit: Payer: 59 | Admitting: Psychiatry

## 2020-12-02 ENCOUNTER — Ambulatory Visit: Payer: 59 | Admitting: Physician Assistant

## 2020-12-02 NOTE — Telephone Encounter (Signed)
Last filled 5/11

## 2020-12-11 ENCOUNTER — Ambulatory Visit: Payer: 59 | Admitting: Physician Assistant

## 2020-12-11 ENCOUNTER — Other Ambulatory Visit: Payer: Self-pay | Admitting: Physician Assistant

## 2020-12-11 ENCOUNTER — Other Ambulatory Visit: Payer: Self-pay

## 2020-12-11 ENCOUNTER — Encounter: Payer: Self-pay | Admitting: Physician Assistant

## 2020-12-11 DIAGNOSIS — G2581 Restless legs syndrome: Secondary | ICD-10-CM

## 2020-12-11 DIAGNOSIS — F411 Generalized anxiety disorder: Secondary | ICD-10-CM

## 2020-12-11 DIAGNOSIS — F329 Major depressive disorder, single episode, unspecified: Secondary | ICD-10-CM

## 2020-12-11 DIAGNOSIS — F4310 Post-traumatic stress disorder, unspecified: Secondary | ICD-10-CM

## 2020-12-11 DIAGNOSIS — F909 Attention-deficit hyperactivity disorder, unspecified type: Secondary | ICD-10-CM

## 2020-12-11 DIAGNOSIS — F4321 Adjustment disorder with depressed mood: Secondary | ICD-10-CM

## 2020-12-11 MED ORDER — LISDEXAMFETAMINE DIMESYLATE 40 MG PO CAPS: 40.0000 mg | ORAL_CAPSULE | ORAL | 0 refills | Status: DC

## 2020-12-11 NOTE — Telephone Encounter (Signed)
Last filled 6/6

## 2020-12-11 NOTE — Progress Notes (Signed)
[facility] Med Check  Patient ID: Sherry Ewing,  MRN: [id number]  PCP: Laurann Montana, MD  Date of Evaluation: 12/11/2020  time spent:30 minutes  Chief Complaint:  Chief Complaint   Anxiety; Depression; Insomnia; ADD      HISTORY/CURRENT STATUS: For routine med check.    She is doing pretty good as far as her medications go.  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  The Restoril really helps.  Gabapentin helps the restlessness in her legs.  Continues to have generalized anxiety and Xanax helps that.  Not really having panic attacks so much just a general sense of unease pretty often.  She does not take the Xanax when she takes Vyvanse.  The last Vyvanse prescription has lasted 4 months, she only takes it when she works and not every shift at that.  Her mom died in 01/12/2023 of last year.  Understandably continues to grieve.  She is dreading next month.  "I will let you know how I am doing after that."  She tearfully talks about her mom and how much she misses her but is able to be consoled.  Patient denies loss of interest in usual activities and is able to enjoy things.  Denies decreased energy or motivation.  Appetite has not changed.  No extreme sadness, tearfulness, or feelings of hopelessness.  Grieving as noted above.  Denies suicidal or homicidal thoughts.  Patient denies increased energy with decreased need for sleep, no increased talkativeness, no racing thoughts, no impulsivity or risky behaviors, no increased spending, no increased libido, no grandiosity, no increased irritability or anger, and no hallucinations.  Denies dizziness, syncope, seizures, numbness, tingling, tremor, tics, unsteady gait, slurred speech, confusion. Denies muscle or joint pain, stiffness, or dystonia.  Individual Medical History/ Review of Systems: Changes? :Yes   Past medications for mental health diagnoses include: Paxil, Buspar, Ambien,  Trazodone, ropinirole possibly cause nausea/vomiting  Allergies: Requip [ropinirole hcl], Seroquel [quetiapine fumarate], and Tramadol hcl  Current Medications:  Current Outpatient Medications:    albuterol (ACCUNEB) 1.25 MG/3ML nebulizer solution, Take 1 ampule by nebulization every 6 (six) hours as needed for wheezing., Disp: , Rfl:    ALPRAZolam (XANAX) 0.5 MG tablet, TAKE 1 TABLET BY MOUTH 2 TIMES DAILY AS NEEDED FOR ANXIETY., Disp: 40 tablet, Rfl: 0   azelastine (ASTELIN) 0.1 % nasal spray, SMARTSIG:1-2 Spray(s) Both Nares Twice Daily, Disp: , Rfl:    BREO ELLIPTA 100-25 MCG/INH AEPB, , Disp: , Rfl:    cetirizine (ZYRTEC) 10 MG tablet, Take 10 mg by mouth daily., Disp: , Rfl:    eletriptan (RELPAX) 40 MG tablet, , Disp: , Rfl:    gabapentin (NEURONTIN) 600 MG tablet, 4 hours before bed., Disp: 30 tablet, Rfl: 5   ondansetron (ZOFRAN-ODT) 8 MG disintegrating tablet, Take 8 mg by mouth every 8 (eight) hours as needed., Disp: , Rfl:    pantoprazole (PROTONIX) 40 MG tablet, Take 40 mg by mouth daily as needed. , Disp: , Rfl:    temazepam (RESTORIL) 15 MG capsule, Take 1-2 capsules (15-30 mg total) by mouth at bedtime as needed for sleep., Disp: 60 capsule, Rfl: 5   vortioxetine HBr (TRINTELLIX) 10 MG TABS tablet, Take 1 tablet (10 mg total) by mouth daily., Disp: 30 tablet, Rfl: 5   vortioxetine HBr (TRINTELLIX) 5 MG TABS tablet, Take 1 tablet (5 mg total) by mouth daily. In addition to Trintellix 10 mg daily, Disp: 30 tablet, Rfl: 2   lisdexamfetamine (  VYVANSE) 40 MG capsule, Take 1 capsule (40 mg total) by mouth every morning., Disp: 30 capsule, Rfl: 0 Medication Side Effects: none  Family Medical/ Social History: Changes?  No  MENTAL HEALTH EXAM:  There were no vitals taken for this visit.There is no height or weight on file to calculate BMI.  General Appearance: Casual, Neat, Well Groomed and Obese  Eye Contact:  Good  Speech:  Clear and Coherent and Normal Rate  Volume:  Normal   Mood:  Euthymic  Affect:  Tearful and as she talks about her Mom who died in Dec 24, 2022 last year.   Thought Process:  Goal Directed and Descriptions of Associations: Intact  Orientation:  Full (Time, Place, and Person)  Thought Content: Logical   Suicidal Thoughts:  No  Homicidal Thoughts:  No  Memory:  WNL  Judgement:  Good  Insight:  Good  Psychomotor Activity:  Normal  Concentration:  Concentration: Good and Attention Span: Good  Recall:  Good  Fund of Knowledge: Good  Language: Good  Assets:  Desire for Improvement  ADL's:  Intact  Cognition: WNL  Prognosis:  Good    DIAGNOSES:    ICD-10-CM   1. Reactive depression  F32.9     2. Attention deficit hyperactivity disorder (ADHD), unspecified ADHD type  F90.9     3. Restless leg syndrome  G25.81     4. Generalized anxiety disorder  F41.1     5. PTSD (post-traumatic stress disorder)  F43.10     6. 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 She saw Merlyn Albert May until he retired.   RECOMMENDATIONS:  PDMP was reviewed.  Last Xanax was 11/18/2020, last Restoril 10/23/2020, last Vyvanse 08/22/2020. I provided 30 minutes of face to face time during this encounter, including time spent before and after the visit in records review, medical decision making, and charting.  Doing well as far as psych meds go, so no changes. Continue gabapentin 600 mg, 1 p.o. around 4 hours before bed.  Continue Xanax 0.5 mg 1 p.o. twice daily as needed.  Do not take with Vyvanse. Continue Vyvanse 40 mg every morning as needed.  Do not take with Xanax. Continue temazepam 15 mg, 1-2 nightly as needed.  (She works night shift.) Continue Trintellix 10 mg + 5 mg daily.  Her counselor through work is trying to find a therapist who does EMDR. Return in 2 months.  [name], PA-C

## 2020-12-15 ENCOUNTER — Other Ambulatory Visit: Payer: Self-pay | Admitting: Physician Assistant

## 2020-12-24 ENCOUNTER — Other Ambulatory Visit: Payer: Self-pay | Admitting: Physician Assistant

## 2020-12-24 NOTE — Telephone Encounter (Signed)
Temazepam filled 5/11

## 2020-12-30 ENCOUNTER — Ambulatory Visit: Payer: 59 | Admitting: Psychiatry

## 2021-01-08 ENCOUNTER — Telehealth: Payer: Self-pay | Admitting: Physician Assistant

## 2021-01-08 NOTE — Telephone Encounter (Signed)
Received fax from [facility] regarding Sherry Ewing. Completion needed for Leave of Absence form. Placed on Traci's desk.

## 2021-01-13 DIAGNOSIS — Z0289 Encounter for other administrative examinations: Secondary | ICD-10-CM

## 2021-01-18 ENCOUNTER — Other Ambulatory Visit: Payer: Self-pay | Admitting: Physician Assistant
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
## 2021-01-23 ENCOUNTER — Other Ambulatory Visit: Payer: Self-pay | Admitting: Physician Assistant

## 2021-01-24 NOTE — Telephone Encounter (Signed)
Last filled 7/12 and 7/13

## 2021-02-14 ENCOUNTER — Encounter: Payer: Self-pay | Admitting: Physician Assistant

## 2021-02-14 ENCOUNTER — Other Ambulatory Visit: Payer: Self-pay

## 2021-02-14 ENCOUNTER — Ambulatory Visit (INDEPENDENT_AMBULATORY_CARE_PROVIDER_SITE_OTHER): Payer: 59 | Admitting: Physician Assistant

## 2021-02-14 DIAGNOSIS — F909 Attention-deficit hyperactivity disorder, unspecified type: Secondary | ICD-10-CM

## 2021-02-14 DIAGNOSIS — F4310 Post-traumatic stress disorder, unspecified: Secondary | ICD-10-CM

## 2021-02-14 DIAGNOSIS — F320 Major depressive disorder, single episode, mild: Secondary | ICD-10-CM

## 2021-02-14 DIAGNOSIS — G4709 Other insomnia: Secondary | ICD-10-CM | POA: Diagnosis not present

## 2021-02-14 DIAGNOSIS — F411 Generalized anxiety disorder: Secondary | ICD-10-CM

## 2021-02-14 DIAGNOSIS — F4321 Adjustment disorder with depressed mood: Secondary | ICD-10-CM

## 2021-02-14 DIAGNOSIS — G2581 Restless legs syndrome: Secondary | ICD-10-CM

## 2021-02-14 MED ORDER — BUPROPION HCL ER (XL) 150 MG PO TB24: 150.0000 mg | ORAL_TABLET | Freq: Every day | ORAL | 1 refills | Status: DC

## 2021-02-14 NOTE — Progress Notes (Signed)
[facility] Med Check  Patient ID: Sherry Ewing,  MRN: [id number]  PCP: Laurann Montana, MD  Date of Evaluation: 02/14/2021 time spent:40 minutes  Chief Complaint:  Chief Complaint   Follow-up; Anxiety; Depression; Insomnia; ADD     HISTORY/CURRENT STATUS: For routine med check.    July was hard b/c anniversary of her Mom's passing. She took off work for a week, might have helped but then again, it might have helped if she'd worked. But she's handling things pretty good. Anxiety is mostly controlled, xanax helps when needed.   Works in EMS and it's stressful. Sleeps a lot when she's off work because she's so tired from working. 12 hour shifts and is exhausting. Is an introvert and is 'quite comfortable with myself.' Works avg 4 nights per week. Doesn't do much for fun. Not crying easily or as often as she had. Appetite is normal and weight is stable. No SI/HI.  Patient denies increased energy with decreased need for sleep, no increased talkativeness, no racing thoughts, no impulsivity or risky behaviors, no increased spending, no increased libido, no grandiosity, no increased irritability or anger, and no hallucinations.  States that attention is good without easy distractibility.  Able to focus on things and finish tasks to completion.   Review of Systems  Constitutional:  Positive for malaise/fatigue.  HENT: Negative.    Eyes: Negative.   Respiratory: Negative.    Cardiovascular: Negative.   Gastrointestinal: Negative.   Genitourinary: Negative.   Musculoskeletal: Negative.   Skin: Negative.   Neurological:  Positive for headaches.  Endo/Heme/Allergies: Negative.   Psychiatric/Behavioral:  Positive for depression.        See HPI   Individual Medical History/ Review of Systems: Changes? :Yes    Back pain, sees a chiropractor. Work aggravates it. Works in Educational psychologist. Headaches have increased the past few weeks.  No neurologic deficits, visual changes, slurred speech or other  neuro abnormalities.  Past medications for mental health diagnoses include: Paxil, Buspar, Ambien, Trazodone, ropinirole possibly cause nausea/vomiting  Allergies: Requip [ropinirole hcl], Seroquel [quetiapine fumarate], and Tramadol hcl  Current Medications:  Current Outpatient Medications:    albuterol (ACCUNEB) 1.25 MG/3ML nebulizer solution, Take 1 ampule by nebulization every 6 (six) hours as needed for wheezing., Disp: , Rfl:    ALPRAZolam (XANAX) 0.5 MG tablet, TAKE 1 TABLET BY MOUTH TWICE A DAY AS NEEDED FOR ANXIETY, Disp: 40 tablet, Rfl: 0   azelastine (ASTELIN) 0.1 % nasal spray, SMARTSIG:1-2 Spray(s) Both Nares Twice Daily, Disp: , Rfl:    BREO ELLIPTA 100-25 MCG/INH AEPB, , Disp: , Rfl:    buPROPion (WELLBUTRIN XL) 150 MG 24 hr tablet, Take 1 tablet (150 mg total) by mouth daily., Disp: 30 tablet, Rfl: 1   cetirizine (ZYRTEC) 10 MG tablet, Take 10 mg by mouth daily., Disp: , Rfl:    eletriptan (RELPAX) 40 MG tablet, , Disp: , Rfl:    gabapentin (NEURONTIN) 600 MG tablet, TAKE 1 TABLET 4 HOURS BEFORE BED, Disp: 30 tablet, Rfl: 4   lisdexamfetamine (VYVANSE) 40 MG capsule, Take 1 capsule (40 mg total) by mouth every morning., Disp: 30 capsule, Rfl: 0   ondansetron (ZOFRAN-ODT) 8 MG disintegrating tablet, Take 8 mg by mouth every 8 (eight) hours as needed., Disp: , Rfl:    pantoprazole (PROTONIX) 40 MG tablet, Take 40 mg by mouth daily as needed. , Disp: , Rfl:    temazepam (RESTORIL) 15 MG capsule, TAKE 1 TO 2 CAPSULES BY MOUTH AT BEDTIME AS NEEDED SLEEP, Disp:  60 capsule, Rfl: 0   TRINTELLIX 10 MG TABS tablet, TAKE 1 TABLET BY MOUTH EVERY DAY, Disp: 30 tablet, Rfl: 0   TRINTELLIX 5 MG TABS tablet, TAKE 1 TABLET (5 MG TOTAL) BY MOUTH DAILY. IN ADDITION TO TRINTELLIX 10 MG DAILY, Disp: 30 tablet, Rfl: 2 Medication Side Effects: none  Family Medical/ Social History: Changes?  No  MENTAL HEALTH EXAM:  There were no vitals taken for this visit.There is no height or weight on file  to calculate BMI.  General Appearance: Casual, Neat, Well Groomed and Obese  Eye Contact:  Good  Speech:  Clear and Coherent and Normal Rate  Volume:  Normal  Mood:  Euthymic  Affect:  Tearful and as she talks about her Mom who died in January 18, 2023 last year.   Thought Process:  Goal Directed and Descriptions of Associations: Intact  Orientation:  Full (Time, Place, and Person)  Thought Content: Logical   Suicidal Thoughts:  No  Homicidal Thoughts:  No  Memory:  WNL  Judgement:  Good  Insight:  Good  Psychomotor Activity:  Normal  Concentration:  Concentration: Good and Attention Span: Good  Recall:  Good  Fund of Knowledge: Good  Language: Good  Assets:  Desire for Improvement  ADL's:  Intact  Cognition: WNL  Prognosis:  Good    DIAGNOSES:    ICD-10-CM   1. Depression, major, single episode, mild (HCC)  F32.0     2. Attention deficit hyperactivity disorder (ADHD), unspecified ADHD type  F90.9     3. Generalized anxiety disorder  F41.1     4. Other insomnia  G47.09     5. PTSD (post-traumatic stress disorder)  F43.10     6. Restless leg syndrome  G25.81     7. Grief  F43.21        Receiving Psychotherapy: Yes      RECOMMENDATIONS:  PDMP was reviewed.  Last temazepam and Xanax filled 01/24/2021.  Last Vyvanse field 12/25/2020. I provided 40 minutes of face to face time during this encounter, including time spent before and after the visit in records review, medical decision making, and charting.   We discussed the lack of energy and motivation piece of depression.  I know some of it is because she works night shift 12-hours 3-4 nights a week and then has to switch over to daytime so she can rest at night.  But I feel like adding Wellbutrin would be beneficial.  Benefits, risks and side effects were discussed and she would like to try it. She still takes the Vyvanse, Xanax, and Restoril only as needed, depending on her work schedule. She will see PCP or neurologist for  increase in headaches. Start Wellbutrin XL 150 mg, 1 p.o. q. a.m. (her morning.) Continue gabapentin 600 mg, 1 p.o. around 4 hours before bed.  Continue Xanax 0.5 mg 1 p.o. twice daily as needed.  Do not take with Vyvanse. Continue Vyvanse 40 mg every morning as needed.  Do not take with Xanax. Continue temazepam 15 mg, 1-2 nightly as needed.  (She works night shift.) Continue Trintellix 10 mg + 5 mg daily.  Continue therapy through her work. Return in 6 to 8 weeks.  [name], PA-C

## 2021-02-26 ENCOUNTER — Other Ambulatory Visit: Payer: Self-pay | Admitting: Physician Assistant

## 2021-02-28 NOTE — Telephone Encounter (Signed)
Please submit if appropriate  Upcoming apt

## 2021-03-02 ENCOUNTER — Other Ambulatory Visit: Payer: Self-pay | Admitting: Physician Assistant

## 2021-03-03 NOTE — Telephone Encounter (Signed)
Last filled 8/12

## 2021-03-10 ENCOUNTER — Ambulatory Visit (INDEPENDENT_AMBULATORY_CARE_PROVIDER_SITE_OTHER): Payer: 59 | Admitting: Pulmonary Disease

## 2021-03-10 ENCOUNTER — Encounter: Payer: Self-pay | Admitting: Pulmonary Disease

## 2021-03-10 ENCOUNTER — Other Ambulatory Visit: Payer: Self-pay

## 2021-03-10 VITALS — BP 130/82 | HR 84 | Temp 98.4°F | Ht 66.0 in | Wt 268.6 lb

## 2021-03-10 DIAGNOSIS — J4540 Moderate persistent asthma, uncomplicated: Secondary | ICD-10-CM

## 2021-03-10 LAB — PULMONARY FUNCTION TEST
FEF 25-75 Post: 3.14 L/sec
FEF 25-75 Pre: 3.1 L/sec
FEF2575-%Change-Post: 1 %
FEF2575-%Pred-Post: 104 %
FEF2575-%Pred-Pre: 103 %
FEV1-%Change-Post: 6 %
FEV1-%Pred-Post: 90 %
FEV1-%Pred-Pre: 85 %
FEV1-Post: 2.77 L
FEV1-Pre: 2.62 L
FEV1FVC-%Change-Post: 0 %
FEV1FVC-%Pred-Pre: 105 %
FEV6-%Change-Post: 6 %
FEV6-%Pred-Post: 87 %
FEV6-%Pred-Pre: 81 %
FEV6-Post: 3.26 L
FEV6-Pre: 3.05 L
FEV6FVC-%Change-Post: 0 %
FEV6FVC-%Pred-Post: 101 %
FEV6FVC-%Pred-Pre: 101 %
FVC-%Change-Post: 6 %
FVC-%Pred-Post: 85 %
FVC-%Pred-Pre: 79 %
FVC-Post: 3.28 L
FVC-Pre: 3.07 L
Post FEV1/FVC ratio: 84 %
Post FEV6/FVC ratio: 99 %
Pre FEV1/FVC ratio: 85 %
Pre FEV6/FVC Ratio: 99 %
RV % pred: 108 %
RV: 1.99 L
TLC % pred: 97 %
TLC: 5.21 L

## 2021-03-10 NOTE — Progress Notes (Signed)
Pt completed Pre and post Spirometry and Pleth today.

## 2021-03-10 NOTE — Patient Instructions (Signed)
Follow up as needed

## 2021-03-10 NOTE — Progress Notes (Signed)
[facility]  Chief Complaint  Patient presents with   Follow-up    Follow up after PFT,     Constitutional:  BP 130/82 (Patient Position: Sitting)   Pulse 84   Temp 98.4 F (36.9 C) (Oral)   Ht 5\' 6"  (1.676 m)   Wt 268 lb 9.6 oz (121.8 kg)   SpO2 99%   BMI 43.35 kg/m   Past Medical History:  ADHD, GERD, Vit D deficiency, Major depression, Migraine headache, Restless leg syndrome, COVID 03 July 2020  Past Surgical History:  Her  has a past surgical history that includes Wisdom tooth extraction.  Brief Summary:  Sherry Ewing is a 50 y.o. female with asthma and allergic rhinitis.      Subjective:   Chest xray from October 2021 showed changes of asthma.  Her PFT today was normal, except for coughing episodes.  She had COVID in January.  Recovered after about 1 month.  Her inhalers help with her breathing.  Her PCP put in a referral for an allergist.  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  She gets claustrophobia and coughing spells when she has to wear APR mask.  Physical Exam:   Appearance - well kempt   ENMT - no sinus tenderness, no oral exudate, no LAN, Mallampati 3 airway, no stridor, deviated septum  Respiratory - equal breath sounds bilaterally, no wheezing or rales  CV - s1s2 regular rate and rhythm, no murmurs  Ext - no clubbing, no edema  Skin - no rashes  Psych - normal mood and affect   Pulmonary testing:  IgE 04/08/20 >> 9 PFT 03/10/21 >> FEV1 2.77 (90%), FEV1% 84, TLC 5.21 (97%), no BD, unable to do DLCO due to coughing  Social History:  She  reports that she has never smoked. She has never used smokeless tobacco. She reports that she does not currently use alcohol. She reports that she does not use drugs.  Family History:  Her family history is significant for brain aneurysm.     Assessment/Plan:   Allergic asthma. - letter written stating that she should  not use APR mask, but is okay to use N95 and surgical face masks - she has referral to an allergist - she will follow up with pulmonary as needed  Time Spent Involved in Patient Care on Day of Examination:  32 minutes  Follow up:   Patient Instructions  Follow up as needed  Medication List:   Allergies as of 03/10/2021       Reactions   Requip [ropinirole Hcl] Nausea And Vomiting   Seroquel [quetiapine Fumarate]    Tramadol Hcl Anxiety   REACTION: shaky crawling feelings        Medication List        Accurate as of March 10, 2021 11:27 AM. If you have any questions, ask your nurse or doctor.          STOP taking these medications    cetirizine 10 MG tablet Commonly known as: ZYRTEC Stopped by: March 12, 2021, MD       TAKE these medications    albuterol 1.25 MG/3ML nebulizer solution Commonly known as: ACCUNEB Take 1 ampule by nebulization every 6 (six) hours as needed for wheezing.   ALPRAZolam 0.5 MG tablet Commonly known as: XANAX TAKE 1 TABLET BY MOUTH TWICE A DAY AS NEEDED FOR ANXIETY   azelastine 0.1 % nasal spray Commonly known  as: ASTELIN SMARTSIG:1-2 Spray(s) Both Nares Twice Daily   Breo Ellipta 100-25 MCG/INH Aepb Generic drug: fluticasone furoate-vilanterol   buPROPion 150 MG 24 hr tablet Commonly known as: Wellbutrin XL Take 1 tablet (150 mg total) by mouth daily.   eletriptan 40 MG tablet Commonly known as: RELPAX   gabapentin 600 MG tablet Commonly known as: NEURONTIN TAKE 1 TABLET 4 HOURS BEFORE BED   lisdexamfetamine 40 MG capsule Commonly known as: VYVANSE Take 1 capsule (40 mg total) by mouth every morning.   ondansetron 8 MG disintegrating tablet Commonly known as: ZOFRAN-ODT Take 8 mg by mouth every 8 (eight) hours as needed.   pantoprazole 40 MG tablet Commonly known as: PROTONIX Take 40 mg by mouth daily as needed.   temazepam 15 MG capsule Commonly known as: RESTORIL TAKE 1 TO 2 CAPSULES BY MOUTH AT BEDTIME  AS NEEDED SLEEP   Trintellix 5 MG Tabs tablet Generic drug: vortioxetine HBr TAKE 1 TABLET (5 MG TOTAL) BY MOUTH DAILY. IN ADDITION TO TRINTELLIX 10 MG DAILY   Trintellix 10 MG Tabs tablet Generic drug: vortioxetine HBr TAKE 1 TABLET BY MOUTH EVERY DAY        Signature:  Coralyn Helling, MD [facility] Pager - [phone] 03/10/2021, 11:27 AM

## 2021-03-28 ENCOUNTER — Other Ambulatory Visit: Payer: Self-pay | Admitting: Physician Assistant

## 2021-03-28 NOTE — Telephone Encounter (Signed)
Last filled 9/16

## 2021-04-05 ENCOUNTER — Other Ambulatory Visit: Payer: Self-pay | Admitting: Physician Assistant

## 2021-04-11 ENCOUNTER — Ambulatory Visit: Payer: 59 | Admitting: Physician Assistant

## 2021-04-16 ENCOUNTER — Telehealth: Payer: Self-pay | Admitting: Physician Assistant

## 2021-04-16 NOTE — Telephone Encounter (Signed)
Yes, she does need to come in, b/c we started a new med (Wellbutrin) and she needed to be checked for that. So make appt before Jan. Thanks.

## 2021-04-16 NOTE — Telephone Encounter (Signed)
Pt called strating  she has apt 1/11 and last seen 9/2. Shows she missed apt 10/28. Pt asking if she needs to see TH before 1/11 apt. Stated she's doing well and confused she had apt 10/28.  Please advise

## 2021-04-17 ENCOUNTER — Telehealth: Payer: Self-pay | Admitting: Physician Assistant

## 2021-04-17 NOTE — Telephone Encounter (Signed)
Pt called in today to get an earlier appt with Rosey Bath. Schedule is full and the pt has limited availability due to work. She is on the wait list to be seen sooner. FYI

## 2021-04-17 NOTE — Telephone Encounter (Addendum)
Called pt and left 2 voice messages to call office. You do need to see her before January appt.

## 2021-04-21 NOTE — Telephone Encounter (Signed)
Pt LM on VM to put on canc list. Pt is aware of needing apt before her scheduled January apt.

## 2021-04-21 NOTE — Telephone Encounter (Signed)
noted 

## 2021-05-04 ENCOUNTER — Other Ambulatory Visit: Payer: Self-pay | Admitting: Physician Assistant

## 2021-05-10 ENCOUNTER — Other Ambulatory Visit: Payer: Self-pay | Admitting: Physician Assistant

## 2021-05-14 ENCOUNTER — Other Ambulatory Visit: Payer: Self-pay | Admitting: Physician Assistant

## 2021-05-14 NOTE — Telephone Encounter (Signed)
Last filled 11/2. 

## 2021-06-11 ENCOUNTER — Other Ambulatory Visit: Payer: Self-pay | Admitting: Physician Assistant

## 2021-06-11 NOTE — Telephone Encounter (Signed)
Xanax filled 11/21 restoril 12/1

## 2021-06-25 ENCOUNTER — Other Ambulatory Visit: Payer: Self-pay

## 2021-06-25 ENCOUNTER — Ambulatory Visit: Payer: 59 | Admitting: Physician Assistant

## 2021-06-25 ENCOUNTER — Encounter: Payer: Self-pay | Admitting: Physician Assistant

## 2021-06-25 DIAGNOSIS — F909 Attention-deficit hyperactivity disorder, unspecified type: Secondary | ICD-10-CM | POA: Diagnosis not present

## 2021-06-25 DIAGNOSIS — F411 Generalized anxiety disorder: Secondary | ICD-10-CM | POA: Diagnosis not present

## 2021-06-25 DIAGNOSIS — G4709 Other insomnia: Secondary | ICD-10-CM

## 2021-06-25 DIAGNOSIS — F3341 Major depressive disorder, recurrent, in partial remission: Secondary | ICD-10-CM | POA: Diagnosis not present

## 2021-06-25 MED ORDER — GABAPENTIN 600 MG PO TABS: ORAL_TABLET | ORAL | 5 refills | Status: DC

## 2021-06-25 MED ORDER — ALPRAZOLAM 0.5 MG PO TABS: ORAL_TABLET | ORAL | 5 refills | Status: DC

## 2021-06-25 MED ORDER — VORTIOXETINE HBR 10 MG PO TABS: 10.0000 mg | ORAL_TABLET | Freq: Every day | ORAL | 5 refills | Status: DC

## 2021-06-25 MED ORDER — LISDEXAMFETAMINE DIMESYLATE 40 MG PO CAPS: 40.0000 mg | ORAL_CAPSULE | ORAL | 0 refills | Status: DC

## 2021-06-25 MED ORDER — BUPROPION HCL ER (XL) 150 MG PO TB24: 150.0000 mg | ORAL_TABLET | Freq: Every day | ORAL | 5 refills | Status: DC

## 2021-06-25 MED ORDER — TEMAZEPAM 15 MG PO CAPS: ORAL_CAPSULE | ORAL | 5 refills | Status: DC

## 2021-06-25 NOTE — Progress Notes (Signed)
[facility]  Patient ID: Sherry Ewing,  MRN: [id number]  PCP: Laurann Montana, MD  Date of Evaluation: 06/25/2021 time spent:30 minutes  Chief Complaint:  Chief Complaint   Anxiety; Depression; Insomnia; ADD; Follow-up      HISTORY/CURRENT STATUS: For routine med check.    Wellbutrin was started at [facility]. She went back to Trintellix 10 mg d/t cost of taking that and the Trintellix 5 mg. Was getting charged $60 for each Rx, and could not afford $160 a month for that drug alone.  That change was made in December and she feels like she is doing okay as far as her mood goes.  It was a little hard getting through the holidays, missing her mom who died a little over a year ago but states she made it through.  She is able to enjoy things when she can.  Energy and motivation are good.  Appetite is normal and weight is stable.  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  No suicidal or homicidal thoughts.  Anxiety is controlled.  She does not need the Xanax daily but usually does take 1 at the beginning of "her weekend" which can be scheduled on different days of the week due to her job.  Feels like she cannot relax and get things off her mind without the Xanax.  She is not having panic attacks.  She sleeps pretty well as long as she takes the temazepam.  She works night shift and has a difficult time sleeping in the day or switching back from being awake during the day to be getting awake at night after she has some time off.  She takes the Vyvanse as needed.  It is still effective.  Usually only takes it when she needs it to stay awake and alert to be able to focus at work.  The last prescription given was a little over 6 months ago.  Patient denies increased energy with decreased need for sleep, no increased talkativeness, no racing thoughts, no impulsivity or risky behaviors, no increased spending, no  increased libido, no grandiosity, no increased irritability or anger, and no hallucinations.  Denies dizziness, syncope, seizures, numbness, tingling, tremor, tics, unsteady gait, slurred speech, confusion.  Has chronic low back pain, seeing a chiropractor, no neurologic deficits.  No dystonia.  Individual Medical History/ Review of Systems: Changes? :Yes    Chronic back pain, sees a [facility]. No worse.  Had flu early December, +test.  Past medications for mental health diagnoses include: Paxil, Buspar, Ambien, Trazodone, ropinirole possibly cause nausea/vomiting  Allergies: Requip [ropinirole hcl], Seroquel [quetiapine fumarate], and Tramadol hcl  Current Medications:  Current Outpatient Medications:    albuterol (ACCUNEB) 1.25 MG/3ML nebulizer solution, Take 1 ampule by nebulization every 6 (six) hours as needed for wheezing., Disp: , Rfl:    azelastine (ASTELIN) 0.1 % nasal spray, SMARTSIG:1-2 Spray(s) Both Nares Twice Daily, Disp: , Rfl:    BREO ELLIPTA 100-25 MCG/INH AEPB, , Disp: , Rfl:    eletriptan (RELPAX) 40 MG tablet, , Disp: , Rfl:    ondansetron (ZOFRAN-ODT) 8 MG disintegrating tablet, Take 8 mg by mouth every 8 (eight) hours as needed., Disp: , Rfl:    pantoprazole (PROTONIX) 40 MG tablet, Take 40 mg by mouth daily as needed. , Disp: , Rfl:    ALPRAZolam (XANAX) 0.5 MG tablet, TAKE 1 TABLET BY MOUTH TWICE A DAY AS NEEDED FOR ANXIETY,  Disp: 60 tablet, Rfl: 5   buPROPion (WELLBUTRIN XL) 150 MG 24 hr tablet, Take 1 tablet (150 mg total) by mouth daily., Disp: 30 tablet, Rfl: 5   gabapentin (NEURONTIN) 600 MG tablet, TAKE 1 TABLET 4 HOURS BEFORE BED, Disp: 30 tablet, Rfl: 5   lisdexamfetamine (VYVANSE) 40 MG capsule, Take 1 capsule (40 mg total) by mouth every morning., Disp: 30 capsule, Rfl: 0   temazepam (RESTORIL) 15 MG capsule, TAKE 1 TO 2 CAPSULES BY MOUTH AT BEDTIME AS NEEDED SLEEP, Disp: 60 capsule, Rfl: 5   vortioxetine HBr (TRINTELLIX) 10 MG TABS tablet, Take 1 tablet  (10 mg total) by mouth daily., Disp: 30 tablet, Rfl: 5 Medication Side Effects: none  Family Medical/ Social History: Changes?  No  MENTAL HEALTH EXAM:  There were no vitals taken for this visit.There is no height or weight on file to calculate BMI.  General Appearance: Casual, Neat, Well Groomed and Obese  Eye Contact:  Good  Speech:  Clear and Coherent and Normal Rate  Volume:  Normal  Mood:  Euthymic  Affect:  Congruent  Thought Process:  Goal Directed and Descriptions of Associations: Intact  Orientation:  Full (Time, Place, and Person)  Thought Content: Logical   Suicidal Thoughts:  No  Homicidal Thoughts:  No  Memory:  WNL  Judgement:  Good  Insight:  Good  Psychomotor Activity:  Normal  Concentration:  Concentration: Good and Attention Span: Good  Recall:  Good  Fund of Knowledge: Good  Language: Good  Assets:  Desire for Improvement  ADL's:  Intact  Cognition: WNL  Prognosis:  Good    DIAGNOSES:    ICD-10-CM   1. Depression, major, recurrent, in partial remission (HCC)  F33.41     2. Generalized anxiety disorder  F41.1     3. Other insomnia  G47.09     4. Attention deficit hyperactivity disorder (ADHD), unspecified ADHD type  F90.9         Receiving Psychotherapy: Yes      RECOMMENDATIONS:  PDMP was reviewed.  Last temazepam and Xanax filled 06/11/2021. Vyvanse filled 12/25/2020. I provided 30 minutes of face to face time during this encounter, including time spent before and after the visit in records review, medical decision making, counseling pertinent to today's visit, and charting.  I am glad to see her doing well so no changes will be made. It is fine for her to have decreased to the Trintellix to 10 mg.  If needed, we can increase it up to 20 mg, that way she will only have to pay for 1 co-pay. Continue Wellbutrin XL 150 mg, 1 p.o. q. a.m. (her morning.) Continue gabapentin 600 mg, 1 p.o. around 4 hours before bed.  Continue Xanax 0.5 mg 1 p.o.  twice daily as needed.  Do not take with Vyvanse. Continue Vyvanse 40 mg every morning as needed.  Do not take with Xanax. Continue temazepam 15 mg, 1-2 nightly as needed.  (She works night shift.) Continue Trintellix 10 mg, 1 p.o. daily. Continue therapy through her work. Return in 6 months.  [name], PA-C

## 2021-12-23 ENCOUNTER — Other Ambulatory Visit: Payer: Self-pay | Admitting: Physician Assistant

## 2021-12-29 ENCOUNTER — Ambulatory Visit: Payer: 59 | Admitting: Physician Assistant

## 2021-12-29 ENCOUNTER — Encounter: Payer: Self-pay | Admitting: Physician Assistant

## 2021-12-29 DIAGNOSIS — F411 Generalized anxiety disorder: Secondary | ICD-10-CM

## 2021-12-29 DIAGNOSIS — G2581 Restless legs syndrome: Secondary | ICD-10-CM

## 2021-12-29 DIAGNOSIS — G4726 Circadian rhythm sleep disorder, shift work type: Secondary | ICD-10-CM

## 2021-12-29 DIAGNOSIS — Z566 Other physical and mental strain related to work: Secondary | ICD-10-CM | POA: Diagnosis not present

## 2021-12-29 DIAGNOSIS — F3341 Major depressive disorder, recurrent, in partial remission: Secondary | ICD-10-CM

## 2021-12-29 MED ORDER — VORTIOXETINE HBR 10 MG PO TABS
10.0000 mg | ORAL_TABLET | Freq: Every day | ORAL | 5 refills | Status: DC
Start: 2021-12-29 — End: 2022-07-23

## 2021-12-29 MED ORDER — GABAPENTIN 600 MG PO TABS: ORAL_TABLET | ORAL | 5 refills | Status: DC

## 2021-12-29 MED ORDER — LISDEXAMFETAMINE DIMESYLATE 40 MG PO CAPS
40.0000 mg | ORAL_CAPSULE | ORAL | 0 refills | Status: DC
Start: 2021-12-29 — End: 2022-03-24

## 2021-12-29 MED ORDER — BUPROPION HCL ER (XL) 150 MG PO TB24: 150.0000 mg | ORAL_TABLET | Freq: Every day | ORAL | 5 refills | Status: DC

## 2021-12-29 NOTE — Progress Notes (Signed)
[facility]  Patient ID: Sherry Ewing,  MRN: [id number]  PCP: Laurann Montana, MD  Date of Evaluation: 12/29/2021 Time spent:30 minutes  Chief Complaint:  Chief Complaint   Anxiety; Depression; Insomnia; ADD; Follow-up    HISTORY/CURRENT STATUS: For routine med check.    Stressed w/ work.  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 Still hasn't settled her Mom's estate, after she passed away 2 years ago, so that is a stressor as well.  She does take the Xanax when she gets too overwhelmed and it is still helpful.  Not usually having panic attacks but more of a generalized sense of unease like something bad may happen at any time.  Feels like her medications are working well though.  Energy and motivation are good.  Not isolating.  Still enjoys things.  ADLs and personal hygiene are normal.  Appetite and weight are stable.  No suicidal or homicidal thoughts.  Still does not sleep well, the temazepam is helping some but she has been on it for many years and does not feel like it is working as well as it did.  She does not want to upset the apple cart by changing right now.  Has trouble falling asleep and staying asleep due to ruminating thoughts.  She also works night shift and that makes falling asleep harder.  The gabapentin helps the restless leg syndrome.  She takes the Vyvanse as needed.  It is still effective.  Usually only takes it when she needs it to stay awake and alert to be able to focus at work.    Patient denies increased energy with decreased need for sleep, no increased talkativeness, no racing thoughts, no impulsivity or risky behaviors, no increased spending, no increased libido, no grandiosity, no increased irritability or anger, and no hallucinations.  Review of Systems  Constitutional: Negative.   HENT: Negative.    Eyes: Negative.   Respiratory: Negative.    Cardiovascular: Negative.   Gastrointestinal: Negative.   Genitourinary:  Negative.   Musculoskeletal:  Positive for back pain.       Chronic low back pain  Skin: Negative.   Neurological: Negative.   Endo/Heme/Allergies: Negative.   Psychiatric/Behavioral:         See HPI    Individual Medical History/ Review of Systems: Changes? :No     Past medications for mental health diagnoses include: Paxil, Buspar, Ambien, Trazodone, ropinirole possibly cause nausea/vomiting, Temazepam  Allergies: Requip [ropinirole hcl], Seroquel [quetiapine fumarate], and Tramadol hcl  Current Medications:  Current Outpatient Medications:    albuterol (ACCUNEB) 1.25 MG/3ML nebulizer solution, Take 1 ampule by nebulization every 6 (six) hours as needed for wheezing., Disp: , Rfl:    ALPRAZolam (XANAX) 0.5 MG tablet, TAKE 1 TABLET BY MOUTH TWICE A DAY AS NEEDED FOR ANXIETY, Disp: 60 tablet, Rfl: 1   azelastine (ASTELIN) 0.1 % nasal spray, SMARTSIG:1-2 Spray(s) Both Nares Twice Daily, Disp: , Rfl:    BREO ELLIPTA 100-25 MCG/INH AEPB, , Disp: , Rfl:    cetirizine (ZYRTEC) 10 MG tablet, 1 tablet, Disp: , Rfl:    eletriptan (RELPAX) 40 MG tablet, , Disp: , Rfl:    ondansetron (ZOFRAN-ODT) 8 MG disintegrating tablet, Take 8 mg by mouth every 8 (eight) hours as needed., Disp: , Rfl:    pantoprazole (PROTONIX) 40 MG tablet, Take 40 mg by mouth daily as needed. , Disp: , Rfl:    temazepam (RESTORIL) 15 MG capsule, TAKE 1 TO 2 CAPSULES  BY MOUTH AT BEDTIME AS NEEDED SLEEP, Disp: 60 capsule, Rfl: 1   buPROPion (WELLBUTRIN XL) 150 MG 24 hr tablet, Take 1 tablet (150 mg total) by mouth daily., Disp: 30 tablet, Rfl: 5   gabapentin (NEURONTIN) 600 MG tablet, TAKE 1 TABLET 4 HOURS BEFORE BED, Disp: 30 tablet, Rfl: 5   lisdexamfetamine (VYVANSE) 40 MG capsule, Take 1 capsule (40 mg total) by mouth every morning., Disp: 30 capsule, Rfl: 0   vortioxetine HBr (TRINTELLIX) 10 MG TABS tablet, Take 1 tablet (10 mg total) by mouth daily., Disp: 30 tablet, Rfl: 5 Medication Side Effects: none  Family  Medical/ Social History: Changes?  No  MENTAL HEALTH EXAM:  There were no vitals taken for this visit.There is no height or weight on file to calculate BMI.  General Appearance: Casual, Neat, Well Groomed and Obese  Eye Contact:  Good  Speech:  Clear and Coherent and Normal Rate  Volume:  Normal  Mood:  Anxious  Affect:  Congruent  Thought Process:  Goal Directed and Descriptions of Associations: Intact  Orientation:  Full (Time, Place, and Person)  Thought Content: Logical   Suicidal Thoughts:  No  Homicidal Thoughts:  No  Memory:  WNL  Judgement:  Good  Insight:  Good  Psychomotor Activity:  Normal  Concentration:  Concentration: Good and Attention Span: Good  Recall:  Good  Fund of Knowledge: Good  Language: Good  Assets:  Desire for Improvement  ADL's:  Intact  Cognition: WNL  Prognosis:  Good    DIAGNOSES:    ICD-10-CM   1. Depression, major, recurrent, in partial remission (HCC)  F33.41     2. Generalized anxiety disorder  F41.1     3. Restless leg syndrome  G25.81     4. Stress at work  Z56.6     5. Shift work sleep disorder  G47.26       Receiving Psychotherapy: No     RECOMMENDATIONS:  PDMP was reviewed.  Last temazepam and Xanax filled 12/24/2021. Vyvanse filled last year. I provided 30 minutes of face to face time during this encounter, including time spent before and after the visit in records review, medical decision making, counseling pertinent to today's visit, and charting.   We discussed different options for sleep.  She has never taken [place] or [address].  Benefits, risks and side effects were discussed and if she needs to switch between visits she can call and I will send in a prescription for either 1 of those.  If that is the case, I would want her to come in in 4 to 6 weeks after making that change.  She verbalizes understanding.  Continue Wellbutrin XL 150 mg, 1 p.o. q. a.m. (her morning.) Continue gabapentin 600 mg, 1 p.o. around 4 hours  before bed.  Continue Xanax 0.5 mg 1 p.o. twice daily as needed.  Do not take with Vyvanse. Continue Vyvanse 40 mg every morning as needed.  Do not take with Xanax.  She rarely takes it. Continue temazepam 15 mg, 1-2 nightly as needed.  (She works night shift.) Continue Trintellix 10 mg, 1 p.o. daily. Recommend MVI, B Complex, Vit D 2,000 IU. Return in 6 months.  [name], PA-C

## 2022-01-26 ENCOUNTER — Other Ambulatory Visit: Payer: Self-pay | Admitting: Physician Assistant

## 2022-01-26 ENCOUNTER — Telehealth: Payer: Self-pay | Admitting: Physician Assistant

## 2022-01-26 NOTE — Telephone Encounter (Signed)
Received fax from [facility] [employer] re: [employer] Pou. Completion needed for FMLA form. Placed in Traci's box.

## 2022-01-27 NOTE — Telephone Encounter (Signed)
Filled 7/12

## 2022-01-28 ENCOUNTER — Other Ambulatory Visit: Payer: Self-pay | Admitting: Physician Assistant

## 2022-01-28 ENCOUNTER — Telehealth: Payer: Self-pay | Admitting: Physician Assistant

## 2022-01-28 MED ORDER — VORTIOXETINE HBR 5 MG PO TABS: 5.0000 mg | ORAL_TABLET | Freq: Every day | ORAL | 5 refills | Status: DC

## 2022-01-28 NOTE — Telephone Encounter (Signed)
Please let her know I sent in Trintellix 5 mg for her to take with the 10 mg, to the [facility] [address]. And will ask nurse to fill out FMLA for 4 days per month as we discussed. Have her make appt in 4-6 wks to f/u this medication change.  Thanks.

## 2022-01-29 NOTE — Telephone Encounter (Signed)
LVM with info.Please schedule appt 4-6 weeks from now

## 2022-02-02 NOTE — Telephone Encounter (Signed)
Noted received and working on her [employer]

## 2022-02-05 DIAGNOSIS — Z0289 Encounter for other administrative examinations: Secondary | ICD-10-CM

## 2022-02-05 NOTE — Telephone Encounter (Signed)
Forms completed and given to [facility] for review and signature.

## 2022-02-05 NOTE — Telephone Encounter (Signed)
Forms faxed

## 2022-02-09 ENCOUNTER — Telehealth: Payer: Self-pay

## 2022-02-24 ENCOUNTER — Telehealth: Payer: Self-pay | Admitting: Physician Assistant

## 2022-02-24 NOTE — Telephone Encounter (Signed)
Addendum to attached message. Sheba called  regarding her FMLA form. She states the 2023 FMLA form needs to say the same thing for the work restrictions as 2022. She states her employer stated the difference on the forms.

## 2022-03-11 NOTE — Telephone Encounter (Signed)
Received FMLA request form. Placed in Traci's box 9/27

## 2022-03-15 NOTE — Telephone Encounter (Signed)
Form was completed just like last year, faxed it again with confirmation.

## 2022-03-24 ENCOUNTER — Telehealth: Payer: Self-pay | Admitting: Physician Assistant

## 2022-03-24 ENCOUNTER — Other Ambulatory Visit: Payer: Self-pay

## 2022-03-24 MED ORDER — LISDEXAMFETAMINE DIMESYLATE 40 MG PO CAPS
40.0000 mg | ORAL_CAPSULE | ORAL | 0 refills | Status: DC
Start: 2022-03-24 — End: 2022-08-28

## 2022-03-24 NOTE — Telephone Encounter (Signed)
Pended.

## 2022-03-24 NOTE — Telephone Encounter (Signed)
Sherry Ewing called today at 10:55 to request refill of her Vyvanse.  Appt 06/29/22.  [facility] [address]

## 2022-04-07 ENCOUNTER — Other Ambulatory Visit: Payer: Self-pay | Admitting: Physician Assistant

## 2022-06-22 ENCOUNTER — Other Ambulatory Visit: Payer: Self-pay | Admitting: Physician Assistant

## 2022-06-23 NOTE — Telephone Encounter (Signed)
Filled both 12/4

## 2022-06-23 NOTE — Telephone Encounter (Signed)
Pt  is at the pharmacy and would like to pick up her scripts while she is already out.

## 2022-06-29 ENCOUNTER — Ambulatory Visit: Payer: 59 | Admitting: Physician Assistant

## 2022-07-12 ENCOUNTER — Other Ambulatory Visit: Payer: Self-pay | Admitting: Physician Assistant

## 2022-07-12 NOTE — Telephone Encounter (Signed)
Please call to schedule an appt, was a no show last appt.

## 2022-07-13 NOTE — Telephone Encounter (Signed)
Lvm for patient to call and schedule 

## 2022-07-22 ENCOUNTER — Other Ambulatory Visit: Payer: Self-pay | Admitting: Physician Assistant

## 2022-07-22 NOTE — Telephone Encounter (Signed)
Sherry Ewing made appt for 3/15.

## 2022-08-13 ENCOUNTER — Other Ambulatory Visit: Payer: Self-pay | Admitting: Physician Assistant

## 2022-08-16 ENCOUNTER — Other Ambulatory Visit: Payer: Self-pay | Admitting: Physician Assistant

## 2022-08-19 ENCOUNTER — Other Ambulatory Visit: Payer: Self-pay | Admitting: Adult Health

## 2022-08-20 NOTE — Telephone Encounter (Signed)
Teresa's patient

## 2022-08-20 NOTE — Telephone Encounter (Signed)
Pt called at 9:17a for refill of [facility]prazolam to [facility].  Next appt 3/15

## 2022-08-28 ENCOUNTER — Ambulatory Visit: Payer: 59 | Admitting: Physician Assistant

## 2022-08-28 ENCOUNTER — Encounter: Payer: Self-pay | Admitting: Physician Assistant

## 2022-08-28 DIAGNOSIS — F909 Attention-deficit hyperactivity disorder, unspecified type: Secondary | ICD-10-CM

## 2022-08-28 DIAGNOSIS — F3341 Major depressive disorder, recurrent, in partial remission: Secondary | ICD-10-CM

## 2022-08-28 DIAGNOSIS — F411 Generalized anxiety disorder: Secondary | ICD-10-CM | POA: Diagnosis not present

## 2022-08-28 DIAGNOSIS — G2581 Restless legs syndrome: Secondary | ICD-10-CM

## 2022-08-28 DIAGNOSIS — Z566 Other physical and mental strain related to work: Secondary | ICD-10-CM

## 2022-08-28 DIAGNOSIS — G4726 Circadian rhythm sleep disorder, shift work type: Secondary | ICD-10-CM | POA: Diagnosis not present

## 2022-08-28 MED ORDER — LISDEXAMFETAMINE DIMESYLATE 40 MG PO CAPS: 40.0000 mg | ORAL_CAPSULE | ORAL | 0 refills | Status: DC

## 2022-08-28 NOTE — Progress Notes (Unsigned)
[facility]  Patient ID: Sherry Ewing,  MRN: [id number]  PCP: Harlan Stains, MD  Date of Evaluation: 08/28/2022 Time spent:20 minutes  Chief Complaint:  Chief Complaint   Anxiety; Depression; ADD; Follow-up; Insomnia    HISTORY/CURRENT STATUS: For routine med check.    Still has trouble with sleep, continues to work night shift. Mostly trouble falling asleep. Restoril is helpful.  Uses it almost every night.  Xanax does not help with sleep, therefore she is taking 2 benzos.  The Xanax is helpful for anxiety when she gets overwhelmed, mostly at work.  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  Not having panic attacks although she feels that 1 might start if she does not take the Xanax early enough.  Out of work last night, was exhausted and did not feel well in general.  Felt better after she got some rest.  Feels like her meds are working. Patient is able to enjoy things.  Energy and motivation are good.  Work is going well.   No extreme sadness, tearfulness, or feelings of hopelessness.  ADLs and personal hygiene are normal.  Appetite has not changed.  Weight is stable.  Denies suicidal or homicidal thoughts.  Patient denies increased energy with decreased need for sleep, increased talkativeness, racing thoughts, impulsivity or risky behaviors, increased spending, increased libido, grandiosity, increased irritability or anger, paranoia, or hallucinations.  Denies dizziness, syncope, seizures, numbness, tingling, tremor, tics, unsteady gait, slurred speech, confusion.  Restless leg not a problem now.  Gabapentin helps.  Denies muscle or joint pain, stiffness, or dystonia.  Individual Medical History/ Review of Systems: Changes? :Yes    started allergy shots in Feb 2024.  Past medications for mental health diagnoses include: Paxil, Buspar, Ambien, Trazodone, ropinirole possibly cause nausea/vomiting, Temazepam  Allergies: Requip [ropinirole hcl], Seroquel  [quetiapine fumarate], and Tramadol hcl  Current Medications:  Current Outpatient Medications:    albuterol (ACCUNEB) 1.25 MG/3ML nebulizer solution, Take 1 ampule by nebulization every 6 (six) hours as needed for wheezing., Disp: , Rfl:    ALPRAZolam (XANAX) 0.5 MG tablet, TAKE 1 TABLET BY MOUTH TWICE A DAY AS NEEDED FOR ANXIETY, Disp: 60 tablet, Rfl: 0   azelastine (ASTELIN) 0.1 % nasal spray, SMARTSIG:1-2 Spray(s) Both Nares Twice Daily, Disp: , Rfl:    BREO ELLIPTA 100-25 MCG/INH AEPB, , Disp: , Rfl:    buPROPion (WELLBUTRIN XL) 150 MG 24 hr tablet, Take 1 tablet (150 mg total) by mouth daily., Disp: 30 tablet, Rfl: 5   cetirizine (ZYRTEC) 10 MG tablet, 1 tablet, Disp: , Rfl:    eletriptan (RELPAX) 40 MG tablet, , Disp: , Rfl:    fluticasone (FLONASE) 50 MCG/ACT nasal spray, , Disp: , Rfl:    gabapentin (NEURONTIN) 600 MG tablet, TAKE 1 TABLET 4 HOURS BEFORE BED, Disp: 30 tablet, Rfl: 5   levocetirizine (XYZAL) 5 MG tablet, Take 5 mg by mouth daily., Disp: , Rfl:    montelukast (SINGULAIR) 10 MG tablet, Take 10 mg by mouth daily., Disp: , Rfl:    ondansetron (ZOFRAN-ODT) 8 MG disintegrating tablet, Take 8 mg by mouth every 8 (eight) hours as needed., Disp: , Rfl:    pantoprazole (PROTONIX) 40 MG tablet, Take 40 mg by mouth daily as needed. , Disp: , Rfl:    temazepam (RESTORIL) 15 MG capsule, TAKE 1 TO 2 CAPSULES BY MOUTH AT BEDTIME AS NEEDED SLEEP, Disp: 60 capsule, Rfl: 0   TRINTELLIX 10 MG TABS tablet, TAKE 1  TABLET BY MOUTH EVERY DAY, Disp: 30 tablet, Rfl: 0   vortioxetine HBr (TRINTELLIX) 5 MG TABS tablet, Take 1 tablet (5 mg total) by mouth daily. Take with the 10 mg pills to equal 15 mg daily., Disp: 30 tablet, Rfl: 5   lisdexamfetamine (VYVANSE) 40 MG capsule, Take 1 capsule (40 mg total) by mouth every morning., Disp: 30 capsule, Rfl: 0 Medication Side Effects: none  Family Medical/ Social History: Changes?  No  MENTAL HEALTH EXAM:  There were no vitals taken for this  visit.There is no height or weight on file to calculate BMI.  General Appearance: Casual, Neat, Well Groomed and Obese  Eye Contact:  Good  Speech:  Clear and Coherent and Normal Rate  Volume:  Normal  Mood:  Euthymic  Affect:  Congruent  Thought Process:  Goal Directed and Descriptions of Associations: Intact  Orientation:  Full (Time, Place, and Person)  Thought Content: Logical   Suicidal Thoughts:  No  Homicidal Thoughts:  No  Memory:  WNL  Judgement:  Good  Insight:  Good  Psychomotor Activity:  Normal  Concentration:  Concentration: Good and Attention Span: Good  Recall:  Good  Fund of Knowledge: Good  Language: Good  Assets:  Desire for Improvement Financial Resources/Insurance Housing Transportation Vocational/Educational  ADL's:  Intact  Cognition: WNL  Prognosis:  Good   DIAGNOSES:    ICD-10-CM   1. Generalized anxiety disorder  F41.1     2. Depression, major, recurrent, in partial remission ([place])  F33.41     3. Restless leg syndrome  G25.81     4. Shift work sleep disorder  G47.26     5. Attention deficit hyperactivity disorder (ADHD), unspecified ADHD type  F90.9     6. Stress at work  Z56.6       Receiving Psychotherapy: No     RECOMMENDATIONS:  PDMP was reviewed.  Last temazepam filled 08/14/2022.  Xanax filled 08/21/2022.  Vyvanse filled 03/24/2022. I provided 20 minutes of face to face time during this encounter, including time spent before and after the visit in records review, medical decision making, counseling pertinent to today's visit, and charting.   Note for out of work last night, 08/27/2022, due to illness.  Continue Xanax 0.5 mg 1 p.o. twice daily as needed.  Do not take with Vyvanse or temazepam. Continue Wellbutrin XL 150 mg, 1 p.o. q. a.m. (her morning.) Continue gabapentin 600 mg, 1 p.o. around 4 hours before bed.  Continue Vyvanse 40 mg every morning as needed.  Do not take with Xanax.  She rarely takes it. Continue temazepam 15 mg,  1-2 nightly as needed.  (She works night shift.) Continue Trintellix 10 + 5 mg daily. Recommend MVI, B Complex, Vit D 2,000 IU. Refer to [employer] [facility], [facility], or [facility]. Return in 6 months.  Donnal Moat, PA-C

## 2022-09-21 ENCOUNTER — Other Ambulatory Visit: Payer: Self-pay | Admitting: Physician Assistant

## 2022-10-26 ENCOUNTER — Other Ambulatory Visit: Payer: Self-pay | Admitting: Physician Assistant

## 2022-10-27 NOTE — Telephone Encounter (Signed)
Pt called at 11:10a.  She would like 6 month refills on Alprazolam, Bupropion, Gabapentin, Temazepam, and Trintellix.  Pls send them all to   [id number], [facility] - [address] [place], [place] [address] Phone: [phone]  Fax: [phone]

## 2022-10-29 ENCOUNTER — Other Ambulatory Visit: Payer: Self-pay

## 2022-10-29 ENCOUNTER — Telehealth: Payer: Self-pay | Admitting: Physician Assistant

## 2022-10-29 MED ORDER — TEMAZEPAM 15 MG PO CAPS: ORAL_CAPSULE | ORAL | 0 refills | Status: DC

## 2022-10-29 MED ORDER — VORTIOXETINE HBR 5 MG PO TABS: 5.0000 mg | ORAL_TABLET | Freq: Every day | ORAL | 5 refills | Status: DC

## 2022-10-29 MED ORDER — GABAPENTIN 600 MG PO TABS: ORAL_TABLET | ORAL | 5 refills | Status: DC

## 2022-10-29 MED ORDER — VORTIOXETINE HBR 10 MG PO TABS: 10.0000 mg | ORAL_TABLET | Freq: Every day | ORAL | 5 refills | Status: DC

## 2022-10-29 MED ORDER — BUPROPION HCL ER (XL) 150 MG PO TB24: 150.0000 mg | ORAL_TABLET | Freq: Every day | ORAL | 5 refills | Status: DC

## 2022-10-29 MED ORDER — ALPRAZOLAM 0.5 MG PO TABS: ORAL_TABLET | ORAL | 0 refills | Status: DC

## 2022-10-29 NOTE — Telephone Encounter (Signed)
Pt left message stating out of several meds. Called 5/13 requesting refills. Did not leave any name of meds. Called to verify had to LVM.      Pt contact [phone] Looks like Alprazolam, Temazepam,& Trintellix  Apt 9/9

## 2022-10-29 NOTE — Telephone Encounter (Signed)
Sent on non-controlled, pended for controlled.

## 2022-11-30 ENCOUNTER — Other Ambulatory Visit: Payer: Self-pay | Admitting: Physician Assistant

## 2023-01-01 ENCOUNTER — Other Ambulatory Visit: Payer: Self-pay | Admitting: Physician Assistant

## 2023-02-07 ENCOUNTER — Other Ambulatory Visit: Payer: Self-pay | Admitting: Physician Assistant

## 2023-02-22 ENCOUNTER — Encounter: Payer: Self-pay | Admitting: Physician Assistant

## 2023-02-22 ENCOUNTER — Ambulatory Visit: Payer: 59 | Admitting: Physician Assistant

## 2023-02-22 DIAGNOSIS — G4709 Other insomnia: Secondary | ICD-10-CM | POA: Diagnosis not present

## 2023-02-22 DIAGNOSIS — F3341 Major depressive disorder, recurrent, in partial remission: Secondary | ICD-10-CM | POA: Diagnosis not present

## 2023-02-22 DIAGNOSIS — F909 Attention-deficit hyperactivity disorder, unspecified type: Secondary | ICD-10-CM | POA: Diagnosis not present

## 2023-02-22 DIAGNOSIS — F411 Generalized anxiety disorder: Secondary | ICD-10-CM

## 2023-02-22 DIAGNOSIS — G4726 Circadian rhythm sleep disorder, shift work type: Secondary | ICD-10-CM

## 2023-02-22 MED ORDER — ALPRAZOLAM 0.5 MG PO TABS: ORAL_TABLET | ORAL | 5 refills | Status: DC

## 2023-02-22 MED ORDER — TEMAZEPAM 15 MG PO CAPS: ORAL_CAPSULE | ORAL | 5 refills | Status: DC

## 2023-02-22 MED ORDER — LISDEXAMFETAMINE DIMESYLATE 40 MG PO CAPS: 40.0000 mg | ORAL_CAPSULE | ORAL | 0 refills | Status: DC

## 2023-02-22 NOTE — Progress Notes (Signed)
[facility]  Patient ID: Sherry Ewing,  MRN: [id number]  PCP: Sherry Montana, MD  Date of Evaluation: 02/22/2023 Time spent:20 minutes  Chief Complaint:  Chief Complaint   Anxiety; Depression; ADHD; Follow-up    HISTORY/CURRENT STATUS: For routine med check.    Feels like meds are still working well. Working hard, is EMT. Patient is able to enjoy things.  Energy and motivation are good. No extreme sadness, tearfulness, or feelings of hopelessness.  Sleeps well most of the time. ADLs and personal hygiene are normal.  Appetite has not changed.  Weight is stable.  Denies suicidal or homicidal thoughts.  Still gets anxious sometimes, not usually dealing w/ PA, but gets overwhelmed easily. Takes Xanax prn. It's helpful. Also takes Temazepam prn for shift work disorder. Doesn't take both BZ together.  Restless leg symptoms are controlled with the gabapentin.  States that attention is good without easy distractibility.  Able to focus on things and finish tasks to completion. She takes the Vyvanse occasionally and it is still helpful.   Patient denies increased energy with decreased need for sleep, increased talkativeness, racing thoughts, impulsivity or risky behaviors, increased spending, increased libido, grandiosity, increased irritability or anger, paranoia, or hallucinations.  Denies dizziness, syncope, seizures, numbness, tingling, tremor, tics, unsteady gait, slurred speech, confusion.  Restless leg not a problem now.  Gabapentin helps.  Denies muscle or joint pain, stiffness, or dystonia.  Individual Medical History/ Review of Systems: Changes? :Yes     had covid a few weeks ago  Past medications for mental health diagnoses include: Paxil, Buspar, Ambien, Trazodone, ropinirole possibly cause nausea/vomiting, Temazepam  Allergies: Requip [ropinirole hcl], Seroquel [quetiapine fumarate], and Tramadol hcl  Current Medications:  Current Outpatient Medications:     albuterol (ACCUNEB) 1.25 MG/3ML nebulizer solution, Take 1 ampule by nebulization every 6 (six) hours as needed for wheezing., Disp: , Rfl:    azelastine (ASTELIN) 0.1 % nasal spray, SMARTSIG:1-2 Spray(s) Both Nares Twice Daily, Disp: , Rfl:    BREO ELLIPTA 100-25 MCG/INH AEPB, , Disp: , Rfl:    buPROPion (WELLBUTRIN XL) 150 MG 24 hr tablet, Take 1 tablet (150 mg total) by mouth daily., Disp: 30 tablet, Rfl: 5   eletriptan (RELPAX) 40 MG tablet, , Disp: , Rfl:    fluticasone (FLONASE) 50 MCG/ACT nasal spray, , Disp: , Rfl:    gabapentin (NEURONTIN) 600 MG tablet, TAKE 1 TABLET 4 HOURS BEFORE BED, Disp: 30 tablet, Rfl: 5   ipratropium (ATROVENT) 0.03 % nasal spray, Place 2 sprays into both nostrils 3 (three) times daily., Disp: , Rfl:    levocetirizine (XYZAL) 5 MG tablet, Take 5 mg by mouth daily., Disp: , Rfl:    montelukast (SINGULAIR) 10 MG tablet, Take 10 mg by mouth daily., Disp: , Rfl:    ondansetron (ZOFRAN-ODT) 8 MG disintegrating tablet, Take 8 mg by mouth every 8 (eight) hours as needed., Disp: , Rfl:    pantoprazole (PROTONIX) 40 MG tablet, Take 40 mg by mouth daily as needed. , Disp: , Rfl:    vortioxetine HBr (TRINTELLIX) 10 MG TABS tablet, Take 1 tablet (10 mg total) by mouth daily., Disp: 30 tablet, Rfl: 5   vortioxetine HBr (TRINTELLIX) 5 MG TABS tablet, Take 1 tablet (5 mg total) by mouth daily. Take with the 10 mg pills to equal 15 mg daily., Disp: 30 tablet, Rfl: 5   ALPRAZolam (XANAX) 0.5 MG tablet, TAKE 1 TABLET BY MOUTH TWICE A DAY AS NEEDED FOR ANXIETY, Disp: 60 tablet, Rfl:  5   cetirizine (ZYRTEC) 10 MG tablet, 1 tablet (Patient not taking: Reported on 02/22/2023), Disp: , Rfl:    lisdexamfetamine (VYVANSE) 40 MG capsule, Take 1 capsule (40 mg total) by mouth every morning., Disp: 30 capsule, Rfl: 0   temazepam (RESTORIL) 15 MG capsule, TAKE 1 TO 2 CAPSULES BY MOUTH AT BEDTIME AS NEEDED SLEEP, Disp: 60 capsule, Rfl: 5 Medication Side Effects: none  Family Medical/ Social  History: Changes?  No  MENTAL HEALTH EXAM:  There were no vitals taken for this visit.There is no height or weight on file to calculate BMI.  General Appearance: Casual, Neat, Well Groomed and Obese  Eye Contact:  Good  Speech:  Clear and Coherent and Normal Rate  Volume:  Normal  Mood:  Euthymic  Affect:  Congruent  Thought Process:  Goal Directed and Descriptions of Associations: Intact  Orientation:  Full (Time, Place, and Person)  Thought Content: Logical   Suicidal Thoughts:  No  Homicidal Thoughts:  No  Memory:  WNL  Judgement:  Good  Insight:  Good  Psychomotor Activity:  Normal  Concentration:  Concentration: Good and Attention Span: Good  Recall:  Good  Fund of Knowledge: Good  Language: Good  Assets:  Desire for Improvement Financial Resources/Insurance Housing Resilience Transportation Vocational/Educational  ADL's:  Intact  Cognition: WNL  Prognosis:  Good   DIAGNOSES:    ICD-10-CM   1. Generalized anxiety disorder  F41.1     2. Depression, major, recurrent, in partial remission (HCC)  F33.41     3. Attention deficit hyperactivity disorder (ADHD), unspecified ADHD type  F90.9     4. Other insomnia  G47.09     5. Shift work sleep disorder  G47.26      Receiving Psychotherapy: No     RECOMMENDATIONS:  PDMP was reviewed.  Last temazepam filled 02/08/2023.  Xanax filled 02/08/2023.  Gabapentin filled 01/25/2023. I provided 20 minutes of face to face time during this encounter, including time spent before and after the visit in records review, medical decision making, counseling pertinent to today's visit, and charting.   Sherry Ewing is doing well so no changes need to be made.  Continue Xanax 0.5 mg 1 p.o. twice daily as needed.  Do not take with Vyvanse or temazepam. Continue Wellbutrin XL 150 mg, 1 p.o. q. a.m. (her morning.) Continue gabapentin 600 mg, 1 p.o. around 4 hours before bed.  Continue Vyvanse 40 mg every morning as needed.  Do not take with  Xanax.  She rarely takes it. Continue temazepam 15 mg, 1-2 nightly as needed.  (She works night shift.) Continue Trintellix 10 + 5 mg daily. Recommend MVI, B Complex, Vit D 2,000 IU. Recommend counseling as needed.   Return in 6 months.  [name], PA-C

## 2023-03-12 ENCOUNTER — Other Ambulatory Visit: Payer: Self-pay | Admitting: Physician Assistant

## 2023-04-21 ENCOUNTER — Other Ambulatory Visit: Payer: Self-pay | Admitting: Physician Assistant

## 2023-04-22 NOTE — Telephone Encounter (Signed)
Lf 10/11

## 2023-04-22 NOTE — Telephone Encounter (Signed)
Perfect

## 2023-05-26 ENCOUNTER — Other Ambulatory Visit: Payer: Self-pay | Admitting: Physician Assistant

## 2023-08-23 ENCOUNTER — Ambulatory Visit: Payer: 59 | Admitting: Physician Assistant

## 2023-09-21 ENCOUNTER — Other Ambulatory Visit: Payer: Self-pay | Admitting: Physician Assistant

## 2023-09-30 ENCOUNTER — Other Ambulatory Visit: Payer: Self-pay | Admitting: Physician Assistant

## 2023-10-18 ENCOUNTER — Encounter: Payer: Self-pay | Admitting: Physician Assistant

## 2023-10-18 ENCOUNTER — Ambulatory Visit (INDEPENDENT_AMBULATORY_CARE_PROVIDER_SITE_OTHER): Payer: 59 | Admitting: Physician Assistant

## 2023-10-18 DIAGNOSIS — F411 Generalized anxiety disorder: Secondary | ICD-10-CM | POA: Diagnosis not present

## 2023-10-18 DIAGNOSIS — F909 Attention-deficit hyperactivity disorder, unspecified type: Secondary | ICD-10-CM | POA: Diagnosis not present

## 2023-10-18 DIAGNOSIS — G4726 Circadian rhythm sleep disorder, shift work type: Secondary | ICD-10-CM

## 2023-10-18 DIAGNOSIS — G2581 Restless legs syndrome: Secondary | ICD-10-CM | POA: Diagnosis not present

## 2023-10-18 DIAGNOSIS — F3342 Major depressive disorder, recurrent, in full remission: Secondary | ICD-10-CM | POA: Diagnosis not present

## 2023-10-18 MED ORDER — LISDEXAMFETAMINE DIMESYLATE 40 MG PO CAPS: 40.0000 mg | ORAL_CAPSULE | ORAL | 0 refills | Status: DC

## 2023-10-18 NOTE — Progress Notes (Signed)
 [facility]  Patient ID: Sherry Ewing,  MRN: [id number]  PCP: Victorio Grave, MD  Date of Evaluation: 10/18/2023 Time spent:20 minutes  Chief Complaint:  Chief Complaint   Anxiety; Depression; Insomnia; Follow-up    HISTORY/CURRENT STATUS: For routine med check.    Not taking the Wellbutrin  daily. Only takes when she has a fm event, stressors at work, training/tests at work. It's effective that way. She doesn't want to take it every day. Never takes it more than a week or 2 at a time. Usually only takes the Trintellix  10 mg daily but adds the 5 mg on days she knows she'll need more coverage for anxiety and sadness.   Patient is able to enjoy things.  Energy and motivation are good.  Work is going well. Busy all the time as paramedic.  No extreme sadness, tearfulness, or feelings of hopelessness.  Sleeps well with the Restoril , only takes when needed, she works night shift. ADLs and personal hygiene are normal.  Appetite has not changed.  Weight is stable.   Denies suicidal or homicidal thoughts.  Vyvanse  is still effective. She only takes it prn. States that attention is good without easy distractibility.  Able to focus on things and finish tasks to completion.   Denies dizziness, syncope, seizures, numbness, tingling, tremor, tics, unsteady gait, slurred speech, confusion.  Restless leg not a problem now.  Gabapentin  helps.  Denies muscle or joint pain, stiffness, or dystonia.  Individual Medical History/ Review of Systems: Changes? :Yes   started Valsartan       Past medications for mental health diagnoses include: Paxil , Buspar, Ambien, Trazodone, ropinirole possibly cause nausea/vomiting, Temazepam   Allergies: Requip [ropinirole hcl], Seroquel [quetiapine fumarate], and Tramadol hcl  Current Medications:  Current Outpatient Medications:    albuterol (ACCUNEB) 1.25 MG/3ML nebulizer solution, Take 1 ampule by nebulization every 6 (six) hours as needed for wheezing.,  Disp: , Rfl:    ALPRAZolam  (XANAX ) 0.5 MG tablet, TAKE 1 TABLET BY MOUTH TWICE A DAY AS NEEDED FOR ANXIETY, Disp: 60 tablet, Rfl: 0   azelastine (ASTELIN) 0.1 % nasal spray, SMARTSIG:1-2 Spray(s) Both Nares Twice Daily, Disp: , Rfl:    BREO ELLIPTA 100-25 MCG/INH AEPB, , Disp: , Rfl:    eletriptan (RELPAX) 40 MG tablet, , Disp: , Rfl:    fluticasone (FLONASE) 50 MCG/ACT nasal spray, , Disp: , Rfl:    gabapentin  (NEURONTIN ) 600 MG tablet, TAKE 1 TABLET BY MOUTH 4 HOURS BEFORE BED, Disp: 30 tablet, Rfl: 0   ipratropium (ATROVENT) 0.03 % nasal spray, Place 2 sprays into both nostrils 3 (three) times daily., Disp: , Rfl:    levocetirizine (XYZAL) 5 MG tablet, Take 5 mg by mouth daily., Disp: , Rfl:    montelukast (SINGULAIR) 10 MG tablet, Take 10 mg by mouth daily., Disp: , Rfl:    ondansetron  (ZOFRAN -ODT) 8 MG disintegrating tablet, Take 8 mg by mouth every 8 (eight) hours as needed., Disp: , Rfl:    pantoprazole (PROTONIX) 40 MG tablet, Take 40 mg by mouth daily as needed. , Disp: , Rfl:    temazepam  (RESTORIL ) 15 MG capsule, TAKE 1 TO 2 CAPSULES BY MOUTH AT BEDTIME AS NEEDED SLEEP, Disp: 60 capsule, Rfl: 0   valsartan (DIOVAN) 160 MG tablet, Take 160 mg by mouth 2 (two) times daily., Disp: , Rfl:    vortioxetine  HBr (TRINTELLIX ) 10 MG TABS tablet, Take 1 tablet (10 mg total) by mouth daily., Disp: 30 tablet, Rfl: 5   vortioxetine  HBr (TRINTELLIX )  5 MG TABS tablet, Take 1 tablet (5 mg total) by mouth daily. Take with the 10 mg pills to equal 15 mg daily., Disp: 30 tablet, Rfl: 5   buPROPion  (WELLBUTRIN  XL) 150 MG 24 hr tablet, Take 1 tablet (150 mg total) by mouth daily. (Patient not taking: Reported on 10/18/2023), Disp: 30 tablet, Rfl: 5   cetirizine (ZYRTEC) 10 MG tablet, 1 tablet (Patient not taking: Reported on 10/18/2023), Disp: , Rfl:    lisdexamfetamine (VYVANSE ) 40 MG capsule, Take 1 capsule (40 mg total) by mouth every morning., Disp: 30 capsule, Rfl: 0 Medication Side Effects: none  Family  Medical/ Social History: Changes?  No  MENTAL HEALTH EXAM:  There were no vitals taken for this visit.There is no height or weight on file to calculate BMI.  General Appearance: Casual, Well Groomed, and Obese  Eye Contact:  Good  Speech:  Clear and Coherent and Normal Rate  Volume:  Normal  Mood:  Euthymic  Affect:  Congruent  Thought Process:  Goal Directed and Descriptions of Associations: Intact  Orientation:  Full (Time, Place, and Person)  Thought Content: Logical   Suicidal Thoughts:  No  Homicidal Thoughts:  No  Memory:  WNL  Judgement:  Good  Insight:  Good  Psychomotor Activity:  Normal  Concentration:  Concentration: Good and Attention Span: Good  Recall:  Good  Fund of Knowledge: Good  Language: Good  Assets:  Desire for Improvement Financial Resources/Insurance Housing Resilience Transportation Vocational/Educational  ADL's:  Intact  Cognition: WNL  Prognosis:  Good   DIAGNOSES:    ICD-10-CM   1. Recurrent major depression in full remission (HCC)  F33.42     2. Generalized anxiety disorder  F41.1     3. Attention deficit hyperactivity disorder (ADHD), unspecified ADHD type  F90.9     4. Restless leg syndrome  G25.81     5. Shift work sleep disorder  G47.26       Receiving Psychotherapy: No     RECOMMENDATIONS:  PDMP was reviewed.  Last temazepam  filled 09/22/2023.  Xanax  filled 09/22/2023.  Gabapentin  filled 09/30/2023. Vyvanse  filled 9/22//2024.  I provided 20 minutes of face to face time during this encounter, including time spent before and after the visit in records review, medical decision making, counseling pertinent to today's visit, and charting.   We discussed the fact that she takes the Wellbutrin  as needed.  That is not really ideal but it is working for her so I will make no changes.  She continues to take the temazepam , Xanax , and Vyvanse  only occasionally and does not mix them all.  She works night shift and is unable to sleep some days if  she does not take the temazepam .  Continue Xanax  0.5 mg 1 p.o. twice daily as needed.  Do not take with Vyvanse  or temazepam . Continue Wellbutrin  XL 150 mg, 1 p.o. q. a.m. (her morning.  She takes it as needed.) Continue gabapentin  600 mg, 1 p.o. around 4 hours before bed.  Continue Vyvanse  40 mg every morning as needed.  Do not take with Xanax .  She rarely takes it. Continue temazepam  15 mg, 1-2 nightly as needed.  (She works night shift.) Continue Trintellix  10 + 5 mg daily. Recommend MVI, B Complex, Vit D 2,000 IU. Return in 6 months.  Marvia Slocumb, PA-C

## 2023-11-03 ENCOUNTER — Other Ambulatory Visit: Payer: Self-pay | Admitting: Physician Assistant

## 2023-12-02 ENCOUNTER — Telehealth: Payer: Self-pay

## 2023-12-02 NOTE — Telephone Encounter (Signed)
 LVM to [facility]

## 2023-12-02 NOTE — Telephone Encounter (Signed)
 Recommend she take the temazepam  every night while on day shift, not just prn. And also take the Wellbutrin  every morning routinely, not 'as needed' like she had been. That will help her get the sleep, but then the stimulant effects of the Wellbutrin  will help get her going in the morning.

## 2023-12-02 NOTE — Telephone Encounter (Signed)
 Pt reports that she usually works night, comes home from work, takes meds and then goes to bed. She reports being hurt at work and they want her to work light duty during the day for at least a couple of weeks. She reports she had just gotten her circadian rhythm in place and is stressing about the change. She reports not having STD, and she is able to work, just in a different capacity. She is asking for recommendations about trying to adjust to the change in sleep schedule/medications.

## 2023-12-03 NOTE — Telephone Encounter (Signed)
 Left second VM to RC.

## 2023-12-18 ENCOUNTER — Other Ambulatory Visit: Payer: Self-pay | Admitting: Physician Assistant

## 2024-04-21 ENCOUNTER — Other Ambulatory Visit: Payer: Self-pay | Admitting: Physician Assistant

## 2024-05-01 ENCOUNTER — Ambulatory Visit (INDEPENDENT_AMBULATORY_CARE_PROVIDER_SITE_OTHER): Admitting: Physician Assistant

## 2024-05-19 ENCOUNTER — Telehealth: Payer: Self-pay | Admitting: Physician Assistant

## 2024-05-19 NOTE — Telephone Encounter (Signed)
 Pt called 9:09a requesting refills of   Alprazolam  Wellbutrin  Gabapentin  Vyvanse  Temazepam  Trintellix   [id number] - [address] [place], [place] [address] Phone: [phone]  Fax: [phone]   Next appt 1/26

## 2024-05-21 ENCOUNTER — Other Ambulatory Visit: Payer: Self-pay | Admitting: Physician Assistant

## 2024-05-21 DIAGNOSIS — G4709 Other insomnia: Secondary | ICD-10-CM

## 2024-05-21 DIAGNOSIS — F411 Generalized anxiety disorder: Secondary | ICD-10-CM

## 2024-05-22 NOTE — Telephone Encounter (Signed)
 Pt has an appt in 1/26 please fill scripts

## 2024-05-22 NOTE — Telephone Encounter (Signed)
 04/16/2024 11/04/2023 1  Temazepam  15 Mg Capsule 60.00 30 [place] [id number] [place] (5429) 3/4 1.50 LME Comm Ins [place] 03/26/2024 11/04/2023 1  Alprazolam  0.5 Mg Tablet 60.00 30

## 2024-05-22 NOTE — Telephone Encounter (Signed)
 Please call to schedule an earlier appt. Was due for FU in November and takes multiple controlled medications.

## 2024-05-22 NOTE — Telephone Encounter (Signed)
 Lvm for pt to call and schedule

## 2024-05-24 ENCOUNTER — Other Ambulatory Visit: Payer: Self-pay

## 2024-05-24 DIAGNOSIS — F909 Attention-deficit hyperactivity disorder, unspecified type: Secondary | ICD-10-CM

## 2024-05-24 MED ORDER — BUPROPION HCL ER (XL) 150 MG PO TB24: 150.0000 mg | ORAL_TABLET | Freq: Every day | ORAL | 0 refills | Status: AC

## 2024-05-24 MED ORDER — VORTIOXETINE HBR 10 MG PO TABS: 10.0000 mg | ORAL_TABLET | Freq: Every day | ORAL | 1 refills | Status: AC

## 2024-05-24 MED ORDER — GABAPENTIN 600 MG PO TABS: ORAL_TABLET | ORAL | 0 refills | Status: DC

## 2024-05-24 MED ORDER — VORTIOXETINE HBR 5 MG PO TABS: 5.0000 mg | ORAL_TABLET | Freq: Every day | ORAL | 1 refills | Status: AC

## 2024-05-24 NOTE — Telephone Encounter (Signed)
 Pended Vyvanse , sent Wellbutrin , gabapentin , and Trintellix .

## 2024-05-25 MED ORDER — LISDEXAMFETAMINE DIMESYLATE 40 MG PO CAPS: 40.0000 mg | ORAL_CAPSULE | ORAL | 0 refills | Status: AC

## 2024-07-10 ENCOUNTER — Encounter: Payer: Self-pay | Admitting: Physician Assistant

## 2024-07-10 ENCOUNTER — Ambulatory Visit: Admitting: Physician Assistant

## 2024-07-10 DIAGNOSIS — G4726 Circadian rhythm sleep disorder, shift work type: Secondary | ICD-10-CM

## 2024-07-10 DIAGNOSIS — F3342 Major depressive disorder, recurrent, in full remission: Secondary | ICD-10-CM

## 2024-07-10 DIAGNOSIS — F411 Generalized anxiety disorder: Secondary | ICD-10-CM | POA: Diagnosis not present

## 2024-07-10 DIAGNOSIS — F909 Attention-deficit hyperactivity disorder, unspecified type: Secondary | ICD-10-CM | POA: Diagnosis not present

## 2024-07-10 NOTE — Progress Notes (Signed)
 "     [facility]  Patient ID: Sherry Ewing,  MRN: [id number]  PCP: Shyne Lehrke Channel, MD  Date of Evaluation: 07/10/2024 Time spent:25 minutes  Chief Complaint:  Chief Complaint   ADHD; Insomnia; Anxiety; Depression; Follow-up   Virtual Visit via Telehealth  I connected with patient by telephone, with their informed consent, and verified patient privacy and that I am speaking with the correct person using two identifiers.  I am private, in my home office and the patient is at home.  I discussed the limitations, risks, security and privacy concerns of performing an evaluation and management service by telephone and the availability of in person appointments. I also discussed with the patient that there may be a patient responsible charge related to this service. The patient expressed understanding and agreed to proceed.   I discussed the assessment and treatment plan with the patient. The patient was provided an opportunity to ask questions and all were answered. The patient agreed with the plan and demonstrated an understanding of the instructions.   The patient was advised to call back or seek an in-person evaluation if the symptoms worsen or if the condition fails to improve as anticipated.  I provided approximately 25 minutes of non-face-to-face time during this encounter.  HISTORY/CURRENT STATUS: For routine med check.    Very sad b/c her 68 yo dog is sick. She feels like her meds are working well and if it wasn't for this sadness, she'd be ok. Energy and motivation are good.  Work is really stressful. 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  Sleeps well most of the time.  She works night shift though and sometimes needs the Temazapam to sleep.  ADLs and personal hygiene are normal.   Adderall is effective when needed.  Denies any changes in concentration, making decisions, or remembering things.  Appetite has not changed.  Weight is stable. Anxiety is  controlled. She doesn't take the Adderall, Xanax  or temazapam together.   No mania, delirium, AH/VH.  No SI/HI.  Individual Medical History/ Review of Systems: Changes? :Yes      had pneumonia in Dec.   Past medications for mental health diagnoses include: Paxil , Buspar, Ambien, Trazodone, ropinirole possibly cause nausea/vomiting, Temazepam   Allergies: Requip [ropinirole hcl], Seroquel [quetiapine fumarate], and Tramadol hcl  Current Medications:  Current Outpatient Medications:    albuterol (ACCUNEB) 1.25 MG/3ML nebulizer solution, Take 1 ampule by nebulization every 6 (six) hours as needed for wheezing., Disp: , Rfl:    ALPRAZolam  (XANAX ) 0.5 MG tablet, TAKE 1 TABLET BY MOUTH TWICE A DAY AS NEEDED FOR ANXIETY, Disp: 60 tablet, Rfl: 0   azelastine (ASTELIN) 0.1 % nasal spray, SMARTSIG:1-2 Spray(s) Both Nares Twice Daily, Disp: , Rfl:    BREO ELLIPTA 100-25 MCG/INH AEPB, , Disp: , Rfl:    buPROPion  (WELLBUTRIN  XL) 150 MG 24 hr tablet, Take 1 tablet (150 mg total) by mouth daily., Disp: 30 tablet, Rfl: 0   cetirizine (ZYRTEC) 10 MG tablet, 1 tablet, Disp: , Rfl:    eletriptan (RELPAX) 40 MG tablet, , Disp: , Rfl:    fluticasone (FLONASE) 50 MCG/ACT nasal spray, , Disp: , Rfl:    gabapentin  (NEURONTIN ) 600 MG tablet, TAKE 1 TABLET BY MOUTH 4 HOURS BEFORE BED, Disp: 30 tablet, Rfl: 0   ipratropium (ATROVENT) 0.03 % nasal spray, Place 2 sprays into both nostrils 3 (three) times daily., Disp: , Rfl:    levocetirizine (XYZAL) 5 MG tablet,  Take 5 mg by mouth daily., Disp: , Rfl:    lisdexamfetamine  (VYVANSE ) 40 MG capsule, Take 1 capsule (40 mg total) by mouth every morning., Disp: 30 capsule, Rfl: 0   montelukast (SINGULAIR) 10 MG tablet, Take 10 mg by mouth daily., Disp: , Rfl:    ondansetron  (ZOFRAN -ODT) 8 MG disintegrating tablet, Take 8 mg by mouth every 8 (eight) hours as needed., Disp: , Rfl:    pantoprazole (PROTONIX) 40 MG tablet, Take 40 mg by mouth daily as needed. , Disp: , Rfl:     temazepam  (RESTORIL ) 15 MG capsule, TAKE 1 TO 2 CAPSULES BY MOUTH AT BEDTIME AS NEEDED SLEEP, Disp: 60 capsule, Rfl: 0   valsartan (DIOVAN) 160 MG tablet, Take 160 mg by mouth 2 (two) times daily., Disp: , Rfl:    vortioxetine  HBr (TRINTELLIX ) 10 MG TABS tablet, Take 1 tablet (10 mg total) by mouth daily., Disp: 30 tablet, Rfl: 1   vortioxetine  HBr (TRINTELLIX ) 5 MG TABS tablet, Take 1 tablet (5 mg total) by mouth daily. Take with the 10 mg pills to equal 15 mg daily.Take 1 tablet (5 mg total) by mouth daily. Take with the 10 mg pills to equal 15 mg daily., Disp: 30 tablet, Rfl: 1 Medication Side Effects: none  Family Medical/ Social History: Changes?  Her 34 yo dog is very sick and may have cancer.    MENTAL HEALTH EXAM:  There were no vitals taken for this visit.There is no height or weight on file to calculate BMI.  General Appearance: unable to assess  Eye Contact:  unable to assess  Speech:  Clear and Coherent and Normal Rate  Volume:  Normal  Mood:  sad  Affect:  unable to assess but can hear she's crying when talking about her dog  Thought Process:  Goal Directed and Descriptions of Associations: Intact  Orientation:  Full (Time, Place, and Person)  Thought Content: Logical   Suicidal Thoughts:  No  Homicidal Thoughts:  No  Memory:  WNL  Judgement:  Good  Insight:  Good  Psychomotor Activity:  unable to assess  Concentration:  Concentration: Good and Attention Span: Good  Recall:  Good  Fund of Knowledge: Good  Language: Good  Assets:  Desire for Improvement Financial Resources/Insurance Housing Resilience Transportation Vocational/Educational  ADL's:  Intact  Cognition: WNL  Prognosis:  Good   DIAGNOSES:    ICD-10-CM   1. Attention deficit hyperactivity disorder (ADHD), unspecified ADHD type  F90.9     2. Generalized anxiety disorder  F41.1     3. Recurrent major depression in full remission  F33.42     4. Shift work sleep disorder  G47.26       Receiving  Psychotherapy: No     RECOMMENDATIONS:  PDMP was reviewed.  Last temazepam  filled 05/23/2024.  Xanax  filled 05/23/2024.  Gabapentin  filled 05/24/2024. Vyvanse  filled 06/22/2024. I provided approximately 25 minutes of non-face-to-face time during this encounter, including time spent before and after the visit in records review, medical decision making, counseling pertinent to today's visit, and charting.   I'm sorry to hear about her dog and hope it gets better.   She's ok as far as current meds go so no changes are needed.  Continue Xanax  0.5 mg 1 p.o. twice daily as needed.  Do not take with Vyvanse  or temazepam . Continue Wellbutrin  XL 150 mg, 1 p.o. q. a.m. (her morning.  She takes it as needed.) Continue gabapentin  600 mg, 1 p.o. around 4 hours before bed.  Continue Vyvanse  40 mg every morning as needed.  Do not take with Xanax .  She rarely takes it. Continue temazepam  15 mg, 1-2 nightly as needed.  (She works night shift.) Continue Trintellix  10 + 5 mg daily. Recommend MVI, B Complex, Vit D 2,000 IU. Return in 6 months.  Verneita Cooks, PA-C  "

## 2024-07-14 ENCOUNTER — Other Ambulatory Visit: Payer: Self-pay | Admitting: Physician Assistant

## 2024-07-14 DIAGNOSIS — G4709 Other insomnia: Secondary | ICD-10-CM

## 2024-07-14 DIAGNOSIS — F411 Generalized anxiety disorder: Secondary | ICD-10-CM
